# Patient Record
Sex: Male | Born: 1955
Health system: Southern US, Community
[De-identification: ages and names within clinical notes are randomized; demographics above are authoritative.]

## PROBLEM LIST (undated history)

## (undated) DIAGNOSIS — M1990 Unspecified osteoarthritis, unspecified site: Secondary | ICD-10-CM

## (undated) DIAGNOSIS — E119 Type 2 diabetes mellitus without complications: Secondary | ICD-10-CM

## (undated) DIAGNOSIS — F840 Autistic disorder: Secondary | ICD-10-CM

## (undated) DIAGNOSIS — Z87442 Personal history of urinary calculi: Secondary | ICD-10-CM

## (undated) DIAGNOSIS — I10 Essential (primary) hypertension: Secondary | ICD-10-CM

## (undated) DIAGNOSIS — M549 Dorsalgia, unspecified: Secondary | ICD-10-CM

---

## 2004-09-23 DIAGNOSIS — N529 Male erectile dysfunction, unspecified: Secondary | ICD-10-CM | POA: Insufficient documentation

## 2007-03-24 DIAGNOSIS — F17200 Nicotine dependence, unspecified, uncomplicated: Secondary | ICD-10-CM | POA: Insufficient documentation

## 2008-02-23 DIAGNOSIS — E1159 Type 2 diabetes mellitus with other circulatory complications: Secondary | ICD-10-CM | POA: Insufficient documentation

## 2008-02-23 DIAGNOSIS — I10 Essential (primary) hypertension: Secondary | ICD-10-CM | POA: Insufficient documentation

## 2008-10-27 DIAGNOSIS — E7800 Pure hypercholesterolemia, unspecified: Secondary | ICD-10-CM | POA: Insufficient documentation

## 2014-07-22 LAB — PSA: PSA: 0.6

## 2014-07-22 LAB — CBC AND DIFFERENTIAL
HCT: 50 % (ref 41–53)
Hemoglobin: 18.1 g/dL — AB (ref 13.5–17.5)
Platelets: 309 10*3/uL (ref 150–399)
WBC: 9.6 10^3/mL

## 2014-07-22 LAB — LIPID PANEL
Cholesterol: 210 mg/dL — AB (ref 0–200)
HDL: 49 mg/dL (ref 35–70)
LDL Cholesterol: 137 mg/dL
LDl/HDL Ratio: 4.3
Triglycerides: 119 mg/dL (ref 40–160)

## 2014-07-22 LAB — BASIC METABOLIC PANEL
BUN: 9 mg/dL (ref 4–21)
Creatinine: 0.9 mg/dL (ref 0.6–1.3)
Glucose: 157 mg/dL
Potassium: 4.3 mmol/L (ref 3.4–5.3)
Sodium: 138 mmol/L (ref 137–147)

## 2014-07-22 LAB — HEPATIC FUNCTION PANEL
ALT: 54 U/L — AB (ref 10–40)
AST: 36 U/L (ref 14–40)
Alkaline Phosphatase: 81 U/L (ref 25–125)
Bilirubin, Total: 0.9 mg/dL

## 2014-07-22 LAB — HEMOGLOBIN A1C: Hemoglobin A1C: 6.7

## 2015-08-03 ENCOUNTER — Encounter: Payer: Self-pay | Admitting: Family Medicine

## 2015-08-14 ENCOUNTER — Other Ambulatory Visit: Payer: Self-pay | Admitting: Family Medicine

## 2015-11-14 ENCOUNTER — Telehealth: Payer: Self-pay | Admitting: Family Medicine

## 2015-11-14 NOTE — Telephone Encounter (Signed)
Pt wife called wanting her husband to get prescriptions for nicotine patches.  He is ready to stop smoking.  She needs an order because she can turn it into flex spending acct.  Her call back is [phone]  Thanks ,Barth Kirks

## 2015-11-14 NOTE — Telephone Encounter (Signed)
Ok to order these for him?

## 2015-11-14 NOTE — Telephone Encounter (Signed)
Dr. Reece Agar, his last visit was on 07/2014  Tobacco abuse, HTN, DM-A1C 6.7 and non-compliance, visit prior to that was 11/2012 Tobacco abuse and HTN, no DM diagnosis or A1C.  Do you want him to come in or send it in? Thanks  ED

## 2015-11-14 NOTE — Telephone Encounter (Signed)
Okay at 14 mg patches.

## 2015-11-14 NOTE — Telephone Encounter (Signed)
When was his last visit? 

## 2015-11-15 NOTE — Telephone Encounter (Signed)
Yes--appt needed--thanks

## 2015-11-15 NOTE — Telephone Encounter (Signed)
Left a message with below information on wife's voicemail, to call and make appointment-aa

## 2015-11-20 DIAGNOSIS — E119 Type 2 diabetes mellitus without complications: Secondary | ICD-10-CM | POA: Insufficient documentation

## 2015-11-21 ENCOUNTER — Ambulatory Visit (INDEPENDENT_AMBULATORY_CARE_PROVIDER_SITE_OTHER): Payer: BLUE CROSS/BLUE SHIELD | Admitting: Family Medicine

## 2015-11-21 ENCOUNTER — Encounter: Payer: Self-pay | Admitting: Family Medicine

## 2015-11-21 VITALS — BP 144/86 | HR 88 | Temp 98.6°F | Resp 16 | Wt 215.0 lb

## 2015-11-21 DIAGNOSIS — F17200 Nicotine dependence, unspecified, uncomplicated: Secondary | ICD-10-CM | POA: Diagnosis not present

## 2015-11-21 DIAGNOSIS — I10 Essential (primary) hypertension: Secondary | ICD-10-CM | POA: Diagnosis not present

## 2015-11-21 MED ORDER — AMLODIPINE BESYLATE 10 MG PO TABS: 10.0000 mg | ORAL_TABLET | Freq: Every evening | ORAL | Status: DC

## 2015-11-21 MED ORDER — BUPROPION HCL ER (XL) 150 MG PO TB24
150.0000 mg | ORAL_TABLET | Freq: Every day | ORAL | Status: DC
Start: 2015-11-21 — End: 2017-03-10

## 2015-11-21 MED ORDER — NICOTINE 21 MG/24HR TD PT24: 21.0000 mg | MEDICATED_PATCH | Freq: Every day | TRANSDERMAL | Status: DC

## 2015-11-21 NOTE — Progress Notes (Signed)
Patient ID: Colin Jackson, male   DOB: 04-12-1956, 60 y.o.   MRN: [id number]    Subjective:  HPI Pt is here today because he is ready to quit smoking. He has been smoking for many years, since about teenage years. He smokes about 2 packs a day. He is interested in patches. He has tried Chantix and he said it made him feel "weird". He tried with the patches in the past, but he did not stick with it.   Prior to Admission medications   Medication Sig Start Date End Date Taking? Authorizing Provider  amLODipine (NORVASC) 10 MG tablet TAKE 1 TABLET IN THE EVENING 08/14/15  Yes Richard Hulen Shouts., MD  hydrochlorothiazide (HYDRODIURIL) 25 MG tablet TAKE 1 TABLET BY MOUTH IN THE MORNING 08/14/15  Yes Richard Hulen Shouts., MD  losartan (COZAAR) 100 MG tablet TAKE 1 TABLET BY MOUTH IN THE EVENING 08/14/15  Yes Richard Hulen Shouts., MD  sildenafil (VIAGRA) 100 MG tablet Take by mouth. 08/30/13  Yes Historical Provider, MD    Patient Active Problem List   Diagnosis Date Noted  . Type 2 diabetes mellitus (HCC) 11/20/2015  . Pure hypercholesterolemia 10/27/2008  . Essential (primary) hypertension 02/23/2008  . Compulsive tobacco user syndrome 03/24/2007  . ED (erectile dysfunction) of organic origin 09/23/2004    History reviewed. No pertinent past medical history.  Social History   Social History  . Marital Status: Married    Spouse Name: N/A  . Number of Children: N/A  . Years of Education: N/A   Occupational History  . Not on file.   Social History Main Topics  . Smoking status: Current Every Day Smoker -- 2.00 packs/day  . Smokeless tobacco: Not on file  . Alcohol Use: Yes     Comment: " I drink a little bit on the weekends"  . Drug Use: No  . Sexual Activity: Not on file   Other Topics Concern  . Not on file   Social History Narrative    Allergies  Allergen Reactions  . Codeine     Review of Systems  Constitutional: Negative.   HENT: Negative.   Eyes: Negative.    Respiratory: Negative.   Cardiovascular: Negative.   Gastrointestinal: Negative.   Genitourinary: Negative.   Musculoskeletal: Negative.   Skin: Negative.   Neurological: Negative.   Endo/Heme/Allergies: Negative.   Psychiatric/Behavioral: Negative.      There is no immunization history on file for this patient. Objective:  BP 144/86 mmHg  Pulse 88  Temp(Src) 98.6 F (37 C) (Oral)  Resp 16  Wt 215 lb (97.523 kg)  SpO2 98%  Physical Exam  Constitutional: He is oriented to person, place, and time and well-developed, well-nourished, and in no distress.  Mildly obese white male, very ruddy complexion  HENT:  Head: Normocephalic.  Right Ear: External ear normal.  Left Ear: External ear normal.  Nose: Nose normal.  Eyes: Conjunctivae and EOM are normal. Pupils are equal, round, and reactive to light.  Neck: Normal range of motion. Neck supple.  Cardiovascular: Normal rate, regular rhythm, normal heart sounds and intact distal pulses.   Pulmonary/Chest: Effort normal and breath sounds normal.  Abdominal: Soft.  Musculoskeletal: Normal range of motion.  Neurological: He is alert and oriented to person, place, and time. He has normal reflexes. Gait normal. GCS score is 15.  Skin: Skin is warm and dry.  Psychiatric: Mood, memory, affect and judgment normal.    Lab Results  Component Value  Date   WBC 9.6 07/22/2014   HGB 18.1* 07/22/2014   HCT 50 07/22/2014   PLT 309 07/22/2014   CHOL 210* 07/22/2014   TRIG 119 07/22/2014   HDL 49 07/22/2014   LDLCALC 137 07/22/2014   PSA 0.6 07/22/2014   HGBA1C 6.7 07/22/2014    CMP     Component Value Date/Time   NA 138 07/22/2014   K 4.3 07/22/2014   BUN 9 07/22/2014   CREATININE 0.9 07/22/2014   AST 36 07/22/2014   ALT 54* 07/22/2014   ALKPHOS 81 07/22/2014    Assessment and Plan :  1. Compulsive tobacco user syndrome Encouraged to quit smoking - nicotine (NICODERM CQ - DOSED IN MG/24 HOURS) 21 mg/24hr patch; Place 1  patch (21 mg total) onto the skin daily.  Dispense: 30 patch; Refill: 3 - buPROPion (WELLBUTRIN XL) 150 MG 24 hr tablet; Take 1 tablet (150 mg total) by mouth daily.  Dispense: 30 tablet; Refill: 3  2. Essential (primary) hypertension Elevated today.  - amLODipine (NORVASC) 10 MG tablet; Take 1 tablet (10 mg total) by mouth every evening.  Dispense: 30 tablet; Refill: 5 3. Chronic noncompliance with follow-up Will only refill medications for 6 months at a time. CPE scheduled for 6 months. I have done the exam and reviewed the above chart and it is accurate to the best of my knowledge.  Patient was seen and examined by Dr. Julieanne Manson, and noted scribed by Dimas Chyle, CMA  Julieanne Manson MD [facility] 11/21/2015 1:58 PM

## 2015-12-30 ENCOUNTER — Other Ambulatory Visit: Payer: Self-pay | Admitting: Family Medicine

## 2016-02-16 ENCOUNTER — Other Ambulatory Visit: Payer: Self-pay | Admitting: Family Medicine

## 2016-05-08 ENCOUNTER — Encounter: Payer: BLUE CROSS/BLUE SHIELD | Admitting: Family Medicine

## 2016-05-20 DIAGNOSIS — M5441 Lumbago with sciatica, right side: Secondary | ICD-10-CM | POA: Diagnosis not present

## 2016-05-20 DIAGNOSIS — M25551 Pain in right hip: Secondary | ICD-10-CM | POA: Diagnosis not present

## 2016-05-20 DIAGNOSIS — M5136 Other intervertebral disc degeneration, lumbar region: Secondary | ICD-10-CM | POA: Diagnosis not present

## 2016-05-28 DIAGNOSIS — M5441 Lumbago with sciatica, right side: Secondary | ICD-10-CM | POA: Diagnosis not present

## 2016-05-28 DIAGNOSIS — M5136 Other intervertebral disc degeneration, lumbar region: Secondary | ICD-10-CM | POA: Diagnosis not present

## 2016-06-08 ENCOUNTER — Other Ambulatory Visit: Payer: Self-pay | Admitting: Family Medicine

## 2016-06-14 DIAGNOSIS — M5441 Lumbago with sciatica, right side: Secondary | ICD-10-CM | POA: Diagnosis not present

## 2016-06-14 DIAGNOSIS — M6281 Muscle weakness (generalized): Secondary | ICD-10-CM | POA: Diagnosis not present

## 2016-06-19 ENCOUNTER — Encounter: Payer: Self-pay | Admitting: Emergency Medicine

## 2016-06-19 ENCOUNTER — Emergency Department: Payer: Self-pay

## 2016-06-19 ENCOUNTER — Emergency Department
Admission: EM | Admit: 2016-06-19 | Discharge: 2016-06-19 | Disposition: A | Discharge status: Home / Self Care | Payer: Self-pay | Attending: Emergency Medicine | Admitting: Emergency Medicine

## 2016-06-19 DIAGNOSIS — R739 Hyperglycemia, unspecified: Secondary | ICD-10-CM

## 2016-06-19 DIAGNOSIS — M5441 Lumbago with sciatica, right side: Secondary | ICD-10-CM | POA: Insufficient documentation

## 2016-06-19 DIAGNOSIS — F17200 Nicotine dependence, unspecified, uncomplicated: Secondary | ICD-10-CM | POA: Insufficient documentation

## 2016-06-19 DIAGNOSIS — E1165 Type 2 diabetes mellitus with hyperglycemia: Secondary | ICD-10-CM | POA: Insufficient documentation

## 2016-06-19 DIAGNOSIS — R52 Pain, unspecified: Secondary | ICD-10-CM

## 2016-06-19 DIAGNOSIS — R202 Paresthesia of skin: Secondary | ICD-10-CM | POA: Insufficient documentation

## 2016-06-19 DIAGNOSIS — I10 Essential (primary) hypertension: Secondary | ICD-10-CM | POA: Insufficient documentation

## 2016-06-19 HISTORY — DX: Dorsalgia, unspecified: M54.9

## 2016-06-19 LAB — GLUCOSE, CAPILLARY: Glucose-Capillary: 295 mg/dL — ABNORMAL HIGH (ref 65–99)

## 2016-06-19 MED ORDER — KETOROLAC TROMETHAMINE 30 MG/ML IJ SOLN
30.0000 mg | Freq: Once | INTRAMUSCULAR | Status: AC
  Administered 2016-06-19: 30 mg via INTRAVENOUS

## 2016-06-19 MED ORDER — HYDROMORPHONE HCL 1 MG/ML IJ SOLN
1.0000 mg | Freq: Once | INTRAMUSCULAR | Status: AC
  Administered 2016-06-19: 1 mg via INTRAVENOUS

## 2016-06-19 MED ORDER — KETOROLAC TROMETHAMINE 30 MG/ML IJ SOLN
INTRAMUSCULAR | Status: AC
  Administered 2016-06-19: 30 mg via INTRAVENOUS
  Filled 2016-06-19: qty 1

## 2016-06-19 MED ORDER — HYDROMORPHONE HCL 1 MG/ML IJ SOLN
INTRAMUSCULAR | Status: AC
  Administered 2016-06-19: 1 mg via INTRAVENOUS
  Filled 2016-06-19: qty 1

## 2016-06-19 MED ORDER — OXYCODONE HCL 5 MG PO TABS: 5.0000 mg | ORAL_TABLET | Freq: Three times a day (TID) | ORAL | 0 refills | Status: DC | PRN

## 2016-06-19 NOTE — Discharge Instructions (Signed)
Continue medication as directed with the exception of tramadol which usually will discontinue taking. Begin taking Roxicodone as needed for pain. Call Dr. Elisabeth CaraGilbert's office to make an appointment to discuss your blood sugar. [facility] to make an appointment for continued management of your back pain.

## 2016-06-19 NOTE — ED Triage Notes (Signed)
Patient to ER for c/o lower back pain. Brought to ER by physical therapy department. Patient has chronic back pain and was being seen by them for the same. Patient states pain is same area, but worse. Patient reports radiating down right leg.

## 2016-06-19 NOTE — ED Notes (Signed)
Iv meds given for pain  Friend at bedside  Pt conts to hyperventilate encourage to take slow breaths

## 2016-06-19 NOTE — ED Notes (Signed)
Patient transported to MRI via stretcher by MRI staff

## 2016-06-19 NOTE — ED Provider Notes (Signed)
[facility] Emergency Department Provider Note   ____________________________________________   First MD Initiated Contact with Patient 06/19/16 1007     (approximate)  I have reviewed the triage vital signs and the nursing notes.   HISTORY  Chief Complaint Back Pain   HPI Colin Jackson is a 60 y.o. male his brother to the emergency room by [facility] when the patient was there for physical therapy. Patient began screaming that his back pain was worse and was brought to the emergency room for pain management and further evaluation. Patient states it is been no new injury and that he has had back pain for many months. Patient states that he has been seeing the orthopedic Department at [facility] for his back and was told that he had lots of arthritis. Currently he is experiencing pain radiating down his right leg. He states the pain is more severe today than what he is experienced in the past. He currently is taking medication that was prescribed including tramadol, prednisone Dosepak, and a muscle relaxant. Patient denies any saddle paresthesias or incontinence of bowel or bladder. Patient actually drove to physical therapy but is calling someone to pick him up. Currently he rates his pain as a 10 over 10.   Past Medical History:  Diagnosis Date  . Back pain     Patient Active Problem List   Diagnosis Date Noted  . Type 2 diabetes mellitus (HCC) 11/20/2015  . Pure hypercholesterolemia 10/27/2008  . Essential (primary) hypertension 02/23/2008  . Compulsive tobacco user syndrome 03/24/2007  . ED (erectile dysfunction) of organic origin 09/23/2004    History reviewed. No pertinent surgical history.  Prior to Admission medications   Medication Sig Start Date End Date Taking? Authorizing Provider  amLODipine (NORVASC) 10 MG tablet Take 1 tablet (10 mg total) by mouth every evening. 11/21/15   Maple Hudson., MD  amLODipine (NORVASC) 10  MG tablet TAKE 1 TABLET BY MOUTH IN THE EVENING 12/30/15   Richard Hulen Shouts., MD  amLODipine (NORVASC) 10 MG tablet TAKE 1 TABLET BY MOUTH IN THE EVENING 02/20/16   Richard Hulen Shouts., MD  buPROPion (WELLBUTRIN XL) 150 MG 24 hr tablet Take 1 tablet (150 mg total) by mouth daily. 11/21/15   Maple Hudson., MD  hydrochlorothiazide (HYDRODIURIL) 25 MG tablet TAKE 1 TABLET BY MOUTH IN THE MORNING 06/10/16   Maple Hudson., MD  losartan (COZAAR) 100 MG tablet TAKE 1 TABLET BY MOUTH IN THE EVENING 12/30/15   Richard Hulen Shouts., MD  losartan (COZAAR) 100 MG tablet TAKE 1 TABLET BY MOUTH IN THE EVENING 02/20/16   Richard Hulen Shouts., MD  oxyCODONE (ROXICODONE) 5 MG immediate release tablet Take 1 tablet (5 mg total) by mouth every 8 (eight) hours as needed. 06/19/16 06/19/17  Tommi Rumps, PA-C  sildenafil (VIAGRA) 100 MG tablet Take by mouth. 08/30/13   Historical Provider, MD    Allergies Codeine  Family History  Problem Relation Age of Onset  . Dementia Father   . Parkinson's disease Father     Social History Social History  Substance Use Topics  . Smoking status: Current Every Day Smoker    Packs/day: 2.00  . Smokeless tobacco: Never Used  . Alcohol use Yes     Comment: " I drink a little bit on the weekends"    Review of Systems Constitutional: No fever/chills Eyes: No visual changes. Cardiovascular: Denies chest pain. Respiratory: Denies  shortness of breath. Gastrointestinal: No abdominal pain.  No nausea, no vomiting.  Genitourinary: Negative for dysuria. Musculoskeletal: Positive for chronic back pain. Positive for right leg paresthesias. Skin: Negative for rash. Neurological: Negative for headaches  10-point ROS otherwise negative.  ____________________________________________   PHYSICAL EXAM:  VITAL SIGNS: ED Triage Vitals  Enc Vitals Group     BP --      Pulse Rate 06/19/16 0940 83     Resp 06/19/16 0940 (!) 22     Temp 06/19/16 0940 97.6 F  (36.4 C)     Temp Source 06/19/16 0940 Axillary     SpO2 06/19/16 0940 98 %     Weight 06/19/16 0940 210 lb (95.3 kg)     Height 06/19/16 0940 5\' 10"  (1.778 m)     Head Circumference --      Peak Flow --      Pain Score 06/19/16 0931 10     Pain Loc --      Pain Edu? --      Excl. in GC? --     Constitutional: Alert and oriented. Well appearing and in no acute distress. Patient appears to be in obvious great deal distress and unable to sit completely on the stretcher due to severe pain in his right lower back and leg. Patient is in the exam room frequently screaming about the pain. Eyes: Conjunctivae are normal. PERRL. EOMI. Head: Atraumatic. Nose: No congestion/rhinnorhea. Neck: No stridor.   Cardiovascular: Normal rate, regular rhythm. Grossly normal heart sounds.  Good peripheral circulation. Respiratory: Normal respiratory effort.  No retractions. Lungs CTAB. Gastrointestinal: Soft and nontender. No distention. Musculoskeletal: Examination of the back there is no gross deformity noted. There is marked tenderness on palpation of the right SI joint area. Range of motion of the right lower extremity is guarded and patient is unable to move or sit secondary to his pain. Straight leg raises were not done due to patient's and tolerable pain. Reflexes grossly one plus bilaterally. There is no point tenderness on palpation of the lumbar spine. Patient was able to stand. Patient is reluctant to light on the stretcher secondary to his pain. Neurologic:  Normal speech and language. No gross focal neurologic deficits are appreciated. No gait instability. Skin:  Skin is warm, dry and intact. No rash noted. Psychiatric: Mood and affect are normal. Speech and behavior are normal.  ____________________________________________   LABS (all labs ordered are listed, but only abnormal results are displayed)  Labs Reviewed  GLUCOSE, CAPILLARY - Abnormal; Notable for the following:       Result Value     Glucose-Capillary 295 (*)    All other components within normal limits  CBG MONITORING, ED   ____________________________________________  RADIOLOGY MRI Lumbar spine per radiologist    IMPRESSION:  1. L5-S1 right foraminal herniation with L5 compression.  2. Facet arthropathy with marrow edema on the left at L4-5.      ____________________________________________   PROCEDURES  Procedure(s) performed: None  Procedures  Critical Care performed: No  ____________________________________________   INITIAL IMPRESSION / ASSESSMENT AND PLAN / ED COURSE  Pertinent labs & imaging results that were available during my care of the patient were reviewed by me and considered in my medical decision making (see chart for details).    Clinical Course  Patient was given Dilaudid and Toradol IV all in the emergency room which eventually gave him some pain relief. After reviewing notes from [facility]  clinic orthopedic department and MRI was  scheduled. Patient was given the results of this examination. He is made aware of the abnormalities. He is to call and follow-up with the orthopedic department for continued pain control as well as therapy. Prior to discharge patient was more comfortable was able to lie on the stretcher without any difficulty. Patient also tolerated the MRI without additional pain medication. Patient was discharged on Roxicodone 5 mg. He is to discontinue taking tramadol for pain. He is continue his other medication as he regular takes.  Patient also had a blood sugar of 295. Her inpatient records it was noted that he was type II diabetic but patient states that he has no knowledge of this. He is encouraged to make an appointment with Dr. Elisabeth CaraGilbert's office as soon as possible to discuss these findings.  ____________________________________________   FINAL CLINICAL IMPRESSION(S) / ED DIAGNOSES  Final diagnoses:  Intractable pain  Right-sided low back pain with  right-sided sciatica  Hyperglycemia      NEW MEDICATIONS STARTED DURING THIS VISIT:  Discharge Medication List as of 06/19/2016 12:49 PM    START taking these medications   Details  oxyCODONE (ROXICODONE) 5 MG immediate release tablet Take 1 tablet (5 mg total) by mouth every 8 (eight) hours as needed., Starting Wed 06/19/2016, Until Thu 06/19/2017, Print         Note:  This document was prepared using Dragon voice recognition software and may include unintentional dictation errors.    Tommi RumpsRhonda L Summers, PA-C 06/19/16 1521    Nita Sicklearolina Veronese, MD 06/20/16 [phone]

## 2016-06-20 DIAGNOSIS — M5410 Radiculopathy, site unspecified: Secondary | ICD-10-CM | POA: Diagnosis not present

## 2016-07-04 DIAGNOSIS — M5126 Other intervertebral disc displacement, lumbar region: Secondary | ICD-10-CM | POA: Diagnosis not present

## 2016-07-04 DIAGNOSIS — M5416 Radiculopathy, lumbar region: Secondary | ICD-10-CM | POA: Diagnosis not present

## 2016-08-01 ENCOUNTER — Other Ambulatory Visit: Payer: Self-pay | Admitting: Family Medicine

## 2016-08-01 LAB — CBC AND DIFFERENTIAL
HCT: 49 % (ref 41–53)
Hemoglobin: 17.4 g/dL (ref 13.5–17.5)
Platelets: 277 10*3/uL (ref 150–399)
WBC: 10 10^3/mL

## 2016-08-01 LAB — HEPATIC FUNCTION PANEL
ALT: 38 U/L (ref 10–40)
AST: 26 U/L (ref 14–40)
Alkaline Phosphatase: 107 U/L (ref 25–125)
Bilirubin, Total: 0.8 mg/dL

## 2016-08-01 LAB — BASIC METABOLIC PANEL
BUN: 12 mg/dL (ref 4–21)
Creatinine: 0.8 mg/dL (ref 0.6–1.3)
Glucose: 282 mg/dL
Potassium: 4.6 mmol/L (ref 3.4–5.3)
Sodium: 137 mmol/L (ref 137–147)

## 2016-08-01 LAB — HEMOGLOBIN A1C: Hemoglobin A1C: 10.3

## 2016-08-01 LAB — LIPID PANEL
Cholesterol: 206 mg/dL — AB (ref 0–200)
HDL: 52 mg/dL (ref 35–70)
LDL Cholesterol: 132 mg/dL
LDl/HDL Ratio: 4
Triglycerides: 108 mg/dL (ref 40–160)

## 2016-08-01 LAB — PSA: PSA: 0.6

## 2016-08-01 MED ORDER — HYDROCHLOROTHIAZIDE 25 MG PO TABS: 25.0000 mg | ORAL_TABLET | Freq: Every morning | ORAL | 0 refills | Status: DC

## 2016-08-01 NOTE — Telephone Encounter (Signed)
Pt's wife contacted office for refill request on the following medications: hydrochlorothiazide (HYDRODIURIL) 25 MG tablet  [facility] [address] Pt is scheduled for F/U on 08/06/16 and is out of the medication.Please advise. Thanks TNP

## 2016-08-01 NOTE — Telephone Encounter (Signed)
Done-aa 

## 2016-08-06 ENCOUNTER — Ambulatory Visit: Payer: BLUE CROSS/BLUE SHIELD | Admitting: Family Medicine

## 2016-08-06 NOTE — Progress Notes (Signed)
o

## 2016-08-07 ENCOUNTER — Other Ambulatory Visit: Payer: Self-pay

## 2016-08-07 ENCOUNTER — Encounter: Payer: Self-pay | Admitting: Family Medicine

## 2016-08-07 ENCOUNTER — Ambulatory Visit (INDEPENDENT_AMBULATORY_CARE_PROVIDER_SITE_OTHER): Payer: BLUE CROSS/BLUE SHIELD | Admitting: Family Medicine

## 2016-08-07 VITALS — BP 142/98 | HR 76 | Temp 98.3°F | Resp 16 | Wt 206.6 lb

## 2016-08-07 DIAGNOSIS — E119 Type 2 diabetes mellitus without complications: Secondary | ICD-10-CM | POA: Diagnosis not present

## 2016-08-07 DIAGNOSIS — F17200 Nicotine dependence, unspecified, uncomplicated: Secondary | ICD-10-CM

## 2016-08-07 DIAGNOSIS — I10 Essential (primary) hypertension: Secondary | ICD-10-CM | POA: Diagnosis not present

## 2016-08-07 LAB — GLUCOSE, POCT (MANUAL RESULT ENTRY): POC Glucose: 356 mg/dl — AB (ref 70–99)

## 2016-08-07 MED ORDER — METFORMIN HCL 1000 MG PO TABS: 1000.0000 mg | ORAL_TABLET | Freq: Every evening | ORAL | 3 refills | Status: DC

## 2016-08-07 MED ORDER — AUTO-LANCET MISC: 3 refills | Status: DC

## 2016-08-07 NOTE — Progress Notes (Signed)
Patient: Colin LinemanCharles R Jackson Male    DOB: Apr 21, 1956   60 y.o.   MRN: [id number] Visit Date: 08/07/2016  Today's Provider: Megan Mansichard Diago Haik Jr, MD   Chief Complaint  Patient presents with  . Follow-up   Subjective:    HPI Patient is here for a 9 month follow up. Last OV was 11/21/2015. Patient started Bupropion and Nicoderm patches for smoking cessation. Patient reports poor compliance with treatment plan. He reports smoking  2 packs per day, and has not tried the medication yet.     Hypertension, follow-up:  BP Readings from Last 3 Encounters:  08/07/16 (!) 142/98  06/19/16 (!) 133/99  11/21/15 (!) 144/86    He was last seen for hypertension 9 months ago.  BP at that visit was 144/86. Management changes since that visit include continue Amlodipine 10 mg. He reports excellent compliance with treatment. He is not having side effects.  He is not exercising. He is adherent to low salt diet.   Outside blood pressures are being checked sometimes. He is experiencing none.  Patient denies chest pain, irregular heart beat and palpitations.   Cardiovascular risk factors include advanced age (older than 7955 for men, 6565 for women), diabetes mellitus, hypertension, male gender and smoking/ tobacco exposure.  Use of agents associated with hypertension: none.     Weight trend: stable Wt Readings from Last 3 Encounters:  08/07/16 206 lb 9.6 oz (93.7 kg)  06/19/16 210 lb (95.3 kg)  11/21/15 215 lb (97.5 kg)    Current diet: in general, an "unhealthy" diet   Diabetes Mellitus Type II, Initial Visit  Lab Results  Component Value Date   HGBA1C 10.3 08/01/2016   HGBA1C 6.7 07/22/2014   Labs done for work insurance. This is a new diagnosis. No previous treatment.       Pertinent Labs:    Component Value Date/Time   CHOL 206 (A) 08/01/2016   TRIG 108 08/01/2016   HDL 52 08/01/2016   LDLCALC 132 08/01/2016   CREATININE 0.8 08/01/2016    Wt Readings from Last 3 Encounters:    08/07/16 206 lb 9.6 oz (93.7 kg)  06/19/16 210 lb (95.3 kg)  11/21/15 215 lb (97.5 kg)    ------------------------------------------------------------------------    Previous Medications   AMLODIPINE (NORVASC) 10 MG TABLET    TAKE 1 TABLET BY MOUTH IN THE EVENING   BUPROPION (WELLBUTRIN XL) 150 MG 24 HR TABLET    Take 1 tablet (150 mg total) by mouth daily.   HYDROCHLOROTHIAZIDE (HYDRODIURIL) 25 MG TABLET    Take 1 tablet (25 mg total) by mouth every morning.   LOSARTAN (COZAAR) 100 MG TABLET    TAKE 1 TABLET BY MOUTH IN THE EVENING   SILDENAFIL (VIAGRA) 100 MG TABLET    Take by mouth.    Review of Systems  Constitutional: Negative.   Respiratory: Negative.   Cardiovascular: Negative.   Endocrine: Negative.   Musculoskeletal: Positive for arthralgias.    Social History  Substance Use Topics  . Smoking status: Current Every Day Smoker    Packs/day: 2.00  . Smokeless tobacco: Never Used  . Alcohol use Yes     Comment: " I drink a little bit on the weekends"   Objective:   BP (!) 142/98 (BP Location: Right Arm, Patient Position: Sitting, Cuff Size: Normal)   Pulse 76   Temp 98.3 F (36.8 C) (Oral)   Resp 16   Wt 206 lb 9.6 oz (93.7 kg)   BMI 29.64  kg/m   Physical Exam  Constitutional: He is oriented to person, place, and time. He appears well-developed and well-nourished.  HENT:  Head: Normocephalic and atraumatic.  Right Ear: External ear normal.  Left Ear: External ear normal.  Mouth/Throat: Oropharynx is clear and moist.  Eyes: Conjunctivae and EOM are normal. Pupils are equal, round, and reactive to light. No scleral icterus.  Neck: Neck supple. No thyromegaly present.  Cardiovascular: Normal rate, regular rhythm, normal heart sounds and intact distal pulses.   Pulmonary/Chest: Effort normal. No respiratory distress. He exhibits no tenderness.  Diffuse mild expiratory wheezes/rhonchi.  Abdominal: Soft. Bowel sounds are normal.  Musculoskeletal: Normal range  of motion.  Neurological: He is alert and oriented to person, place, and time. He has normal reflexes.  Skin: Skin is warm and dry.  Psychiatric: He has a normal mood and affect. His behavior is normal. Judgment and thought content normal.        Assessment & Plan:     1. Essential (primary) hypertension BP elevated today. Continue Amlodipine 10 mg, HCTZ 25 mg and Losartan 100 mg. Patient advised to bring in BP monitor to calibrate at next visit.  2. Type 2 diabetes mellitus without complication, without long-term current use of insulin (HCC) New diagnosis. Recommended referral to lifestyle center. Patient declined Try to cut back on alcohol intake to 1 drink per day. Start Metformin 1000 mg every evening. Glucometer given in office. Test strips and lancets sent to pharmacy. Check FBS every morning. Exercise as tolerated due to back pain. Advised patient that he will definitely need to be compliant with new diagnosis.   3. Compulsive tobacco user syndrome Try to cut back on smoking. Pt is aware of his need to make a lot of changes and says he plans to do this. 4.Early COPD Spirometry on next OV. 5.Noncompliance with follow up Follow up: Return in about 3 months (around 11/07/2016).

## 2016-08-12 ENCOUNTER — Encounter: Payer: Self-pay | Admitting: Family Medicine

## 2016-08-28 ENCOUNTER — Other Ambulatory Visit: Payer: Self-pay | Admitting: Family Medicine

## 2016-08-29 DIAGNOSIS — M5417 Radiculopathy, lumbosacral region: Secondary | ICD-10-CM | POA: Diagnosis not present

## 2016-09-14 ENCOUNTER — Other Ambulatory Visit: Payer: Self-pay | Admitting: Family Medicine

## 2016-11-07 ENCOUNTER — Ambulatory Visit (INDEPENDENT_AMBULATORY_CARE_PROVIDER_SITE_OTHER): Payer: BLUE CROSS/BLUE SHIELD | Admitting: Family Medicine

## 2016-11-07 ENCOUNTER — Encounter: Payer: Self-pay | Admitting: Family Medicine

## 2016-11-07 VITALS — BP 152/98 | HR 82 | Temp 97.9°F | Resp 16 | Wt 198.0 lb

## 2016-11-07 DIAGNOSIS — J439 Emphysema, unspecified: Secondary | ICD-10-CM | POA: Diagnosis not present

## 2016-11-07 DIAGNOSIS — E119 Type 2 diabetes mellitus without complications: Secondary | ICD-10-CM | POA: Diagnosis not present

## 2016-11-07 DIAGNOSIS — F17200 Nicotine dependence, unspecified, uncomplicated: Secondary | ICD-10-CM | POA: Diagnosis not present

## 2016-11-07 DIAGNOSIS — I10 Essential (primary) hypertension: Secondary | ICD-10-CM | POA: Diagnosis not present

## 2016-11-07 LAB — POCT GLYCOSYLATED HEMOGLOBIN (HGB A1C): Hemoglobin A1C: 7.5

## 2016-11-07 NOTE — Progress Notes (Signed)
Subjective:  HPI  Diabetes Mellitus Type II, Follow-up:   Lab Results  Component Value Date   HGBA1C 10.3 08/01/2016   HGBA1C 6.7 07/22/2014    Last seen for diabetes 3 months ago.  Management since then includes Start Metformin. He reports poor compliance with treatment. He is not having side effects.  Home blood sugar records: not being checked  Episodes of hypoglycemia? no   Current Insulin Regimen: n/a  Current exercise: none  Pertinent Labs:    Component Value Date/Time   CHOL 206 (A) 08/01/2016   TRIG 108 08/01/2016   HDL 52 08/01/2016   LDLCALC 132 08/01/2016   CREATININE 0.8 08/01/2016    Wt Readings from Last 3 Encounters:  11/07/16 198 lb (89.8 kg)  08/07/16 206 lb 9.6 oz (93.7 kg)  06/19/16 210 lb (95.3 kg)    ------------------------------------------------------------------------  Hypertension, follow-up:  BP Readings from Last 3 Encounters:  11/07/16 (!) 152/98  08/07/16 (!) 142/98  06/19/16 (!) 133/99    He was last seen for hypertension 3 months ago.  BP at that visit was 142/98. Management since that visit includes none. He reports fair compliance with treatment. He is not having side effects.  He is not exercising. He is not adherent to low salt diet.   Wt Readings from Last 3 Encounters:  11/07/16 198 lb (89.8 kg)  08/07/16 206 lb 9.6 oz (93.7 kg)  06/19/16 210 lb (95.3 kg)   ------------------------------------------------------------------------  Early COPD- pt is still smoking. Spirometry performed per last OV note. Pt denies shortness of breath   Prior to Admission medications   Medication Sig Start Date End Date Taking? Authorizing Provider  amLODipine (NORVASC) 10 MG tablet TAKE 1 TABLET BY MOUTH IN THE EVENING 02/20/16   Richard Hulen Shouts., MD  buPROPion (WELLBUTRIN XL) 150 MG 24 hr tablet Take 1 tablet (150 mg total) by mouth daily. Patient not taking: Reported on 08/07/2016 11/21/15   Maple Hudson., MD    hydrochlorothiazide (HYDRODIURIL) 25 MG tablet TAKE 1 TABLET (25 MG TOTAL) BY MOUTH EVERY MORNING.**NEEDS OFFICE VISIT** 08/28/16   Maple Hudson., MD  Lancet Devices (AUTO-LANCET) MISC One Touch Verio Flex Lancets and test strips. Check fasting blood sugars each morning. DX:E11.9 08/07/16   Maple Hudson., MD  losartan (COZAAR) 100 MG tablet TAKE 1 TABLET BY MOUTH IN THE EVENING 09/15/16   Richard Hulen Shouts., MD  metFORMIN (GLUCOPHAGE) 1000 MG tablet Take 1 tablet (1,000 mg total) by mouth every evening. 08/07/16   Richard Hulen Shouts., MD  sildenafil (VIAGRA) 100 MG tablet Take by mouth. 08/30/13   Historical Provider, MD    Patient Active Problem List   Diagnosis Date Noted  . Type 2 diabetes mellitus (HCC) 11/20/2015  . Pure hypercholesterolemia 10/27/2008  . Essential (primary) hypertension 02/23/2008  . Compulsive tobacco user syndrome 03/24/2007  . ED (erectile dysfunction) of organic origin 09/23/2004    Past Medical History:  Diagnosis Date  . Back pain     Social History   Social History  . Marital status: Married    Spouse name: N/A  . Number of children: N/A  . Years of education: N/A   Occupational History  . Not on file.   Social History Main Topics  . Smoking status: Current Every Day Smoker    Packs/day: 2.00  . Smokeless tobacco: Never Used  . Alcohol use Yes     Comment: " I drink a little  bit on the weekends"  . Drug use: No  . Sexual activity: Not on file   Other Topics Concern  . Not on file   Social History Narrative  . No narrative on file    Allergies  Allergen Reactions  . Codeine     Review of Systems  Constitutional: Negative.   HENT: Negative.   Eyes: Negative.   Respiratory: Negative.   Cardiovascular: Negative.   Gastrointestinal: Negative.   Genitourinary: Negative.   Musculoskeletal: Negative.   Skin: Negative.   Neurological: Negative.   Endo/Heme/Allergies: Negative.   Psychiatric/Behavioral:  Negative.      There is no immunization history on file for this patient.  Objective:  BP (!) 152/98 (BP Location: Left Arm, Patient Position: Sitting, Cuff Size: Normal)   Pulse 82   Temp 97.9 F (36.6 C) (Oral)   Resp 16   Wt 198 lb (89.8 kg)   SpO2 99%   BMI 28.41 kg/m   Physical Exam  Constitutional: He is oriented to person, place, and time and well-developed, well-nourished, and in no distress.  HENT:  Head: Normocephalic and atraumatic.  Right Ear: External ear normal.  Left Ear: External ear normal.  Nose: Nose normal.  Eyes: Conjunctivae and EOM are normal. Pupils are equal, round, and reactive to light.  Neck: Normal range of motion. Neck supple.  Cardiovascular: Normal rate, regular rhythm, normal heart sounds and intact distal pulses.   Pulmonary/Chest: Effort normal and breath sounds normal.  Abdominal: Soft.  Musculoskeletal: Normal range of motion.  Neurological: He is alert and oriented to person, place, and time. He has normal reflexes. Gait normal. GCS score is 15.  Skin: Skin is warm and dry.  Psychiatric: Mood, memory, affect and judgment normal.    Lab Results  Component Value Date   WBC 10.0 08/01/2016   HGB 17.4 08/01/2016   HCT 49 08/01/2016   PLT 277 08/01/2016   CHOL 206 (A) 08/01/2016   TRIG 108 08/01/2016   HDL 52 08/01/2016   LDLCALC 132 08/01/2016   PSA 0.6 08/01/2016   HGBA1C 10.3 08/01/2016    CMP     Component Value Date/Time   NA 137 08/01/2016   K 4.6 08/01/2016   BUN 12 08/01/2016   CREATININE 0.8 08/01/2016   AST 26 08/01/2016   ALT 38 08/01/2016   ALKPHOS 107 08/01/2016    Assessment and Plan :  1. Type 2 diabetes mellitus without complication, without long-term current use of insulin (HCC) Patient has done well in  losing 10 pounds since his last visit. - POCT HgB A1C 7.5 today. Improved. He is not taking Metformin. As long as he continues lifestyle changes he does not need medication. Follow up in 4 months.   2.  Essential (primary) hypertension Elevated.   3. Compulsive tobacco user syndrome Quit. - Spirometry with Graph  4. Pulmonary emphysema, unspecified emphysema type (HCC)  I have done the exam and reviewed the chart and it is accurate to the best of my knowledge. DentistDragon  technology has been used and  any errors in dictation or transcription are unintentional. Julieanne Mansonichard Gilbert M.D. [facility]   HPI, Exam, and A&P Transcribed under the direction and in the presence of Richard L. Wendelyn BreslowGilbert Jr, MD  Electronically Signed: Silvio PateBrittany O'Dell, CMA  Julieanne Mansonichard Gilbert MD [facility] 11/07/2016 8:40 AM

## 2016-12-29 ENCOUNTER — Other Ambulatory Visit: Payer: Self-pay | Admitting: Family Medicine

## 2017-03-10 ENCOUNTER — Ambulatory Visit (INDEPENDENT_AMBULATORY_CARE_PROVIDER_SITE_OTHER): Payer: BLUE CROSS/BLUE SHIELD | Admitting: Family Medicine

## 2017-03-10 ENCOUNTER — Encounter: Payer: Self-pay | Admitting: Family Medicine

## 2017-03-10 VITALS — BP 158/86 | HR 84 | Resp 18 | Wt 195.0 lb

## 2017-03-10 DIAGNOSIS — Z5320 Procedure and treatment not carried out because of patient's decision for unspecified reasons: Secondary | ICD-10-CM | POA: Diagnosis not present

## 2017-03-10 DIAGNOSIS — I10 Essential (primary) hypertension: Secondary | ICD-10-CM | POA: Diagnosis not present

## 2017-03-10 DIAGNOSIS — Z289 Immunization not carried out for unspecified reason: Secondary | ICD-10-CM | POA: Diagnosis not present

## 2017-03-10 DIAGNOSIS — E7800 Pure hypercholesterolemia, unspecified: Secondary | ICD-10-CM

## 2017-03-10 DIAGNOSIS — E119 Type 2 diabetes mellitus without complications: Secondary | ICD-10-CM

## 2017-03-10 DIAGNOSIS — F17200 Nicotine dependence, unspecified, uncomplicated: Secondary | ICD-10-CM | POA: Diagnosis not present

## 2017-03-10 DIAGNOSIS — Z2821 Immunization not carried out because of patient refusal: Secondary | ICD-10-CM

## 2017-03-10 LAB — POCT GLYCOSYLATED HEMOGLOBIN (HGB A1C): Hemoglobin A1C: 6.5

## 2017-03-10 NOTE — Patient Instructions (Signed)
Please get a record of your last eye exam for us to update your records please. Will get your Tdap and Pneumonia vaccine on the next visit.  When you have your next lab work done through work please ask them to have Hepatitis C screening done. This is just a recommended test for people that were born between certain years.  Thank you so much.

## 2017-03-10 NOTE — Progress Notes (Signed)
Colin LinemanCharles R Jackson  MRN: [id number] DOB: 1956/06/24  Subjective:  HPI  Patient is here for 4 months follow up. Last office visit was on 11/07/16. DM: patient is not checking his sugar at home. He is working on dietary changes. No numbness or tingling sensation present. Patient had eye exam in 2017 thinks it was with [employer] maybe. Lab Results  Component Value Date   HGBA1C 7.5 11/07/2016   B/P: patient checks his b/p sometimes. Readings around 150s/80s or so. No cardiac symptoms. BP Readings from Last 3 Encounters:  03/10/17 (!) 158/86  11/07/16 (!) 152/98  08/07/16 (!) 142/98   Wt Readings from Last 3 Encounters:  03/10/17 195 lb (88.5 kg)  11/07/16 198 lb (89.8 kg)  08/07/16 206 lb 9.6 oz (93.7 kg)   Last lab work was done on 08/01/16. Patient states he will have lab work done through work in 3 weeks. Patient declines getting immunizations today or urine micro albumin. He knows he needs to quit smoking but has little desire to make any other changes. Patient Active Problem List   Diagnosis Date Noted  . Type 2 diabetes mellitus (HCC) 11/20/2015  . Pure hypercholesterolemia 10/27/2008  . Essential (primary) hypertension 02/23/2008  . Compulsive tobacco user syndrome 03/24/2007  . ED (erectile dysfunction) of organic origin 09/23/2004    Past Medical History:  Diagnosis Date  . Back pain     Social History   Social History  . Marital status: Married    Spouse name: N/A  . Number of children: N/A  . Years of education: N/A   Occupational History  . Not on file.   Social History Main Topics  . Smoking status: Current Every Day Smoker    Packs/day: 2.00  . Smokeless tobacco: Never Used  . Alcohol use Yes     Comment: 2 malt beers a day  . Drug use: No  . Sexual activity: Not on file   Other Topics Concern  . Not on file   Social History Narrative  . No narrative on file    Outpatient Encounter Prescriptions as of 03/10/2017  Medication Sig Note  .  amLODipine (NORVASC) 10 MG tablet TAKE 1 TABLET BY MOUTH IN THE EVENING   . hydrochlorothiazide (HYDRODIURIL) 25 MG tablet TAKE 1 TABLET (25 MG TOTAL) BY MOUTH EVERY MORNING.**NEEDS OFFICE VISIT**   . losartan (COZAAR) 100 MG tablet TAKE 1 TABLET BY MOUTH IN THE EVENING   . [DISCONTINUED] buPROPion (WELLBUTRIN XL) 150 MG 24 hr tablet Take 1 tablet (150 mg total) by mouth daily. (Patient not taking: Reported on 08/07/2016)   . [DISCONTINUED] hydrochlorothiazide (HYDRODIURIL) 25 MG tablet TAKE 1 TABLET (25 MG TOTAL) BY MOUTH EVERY MORNING.**NEEDS OFFICE VISIT**   . [DISCONTINUED] Lancet Devices (AUTO-LANCET) MISC One Touch Verio Flex Lancets and test strips. Check fasting blood sugars each morning. DX:E11.9 (Patient not taking: Reported on 11/07/2016)   . [DISCONTINUED] metFORMIN (GLUCOPHAGE) 1000 MG tablet Take 1 tablet (1,000 mg total) by mouth every evening. (Patient not taking: Reported on 11/07/2016)   . [DISCONTINUED] sildenafil (VIAGRA) 100 MG tablet Take by mouth. 11/20/2015: Medication taken as needed. DX: 607.84 [facility]   No facility-administered encounter medications on file as of 03/10/2017.     Allergies  Allergen Reactions  . Codeine     Review of Systems  Constitutional: Negative.   Eyes: Negative.   Respiratory: Negative.   Cardiovascular: Negative.   Gastrointestinal: Negative.   Genitourinary: Negative.   Musculoskeletal:  Positive for back pain (off and on).  Skin: Negative.   Neurological: Negative.   Endo/Heme/Allergies: Negative.   Psychiatric/Behavioral: Negative.     Objective:  BP (!) 158/86   Pulse 84   Resp 18   Wt 195 lb (88.5 kg)   BMI 27.98 kg/m   Physical Exam  Constitutional: He is oriented to person, place, and time and well-developed, well-nourished, and in no distress.  HENT:  Head: Normocephalic and atraumatic.  Right Ear: External ear normal.  Left Ear: External ear normal.  Nose: Nose normal.  Eyes:  Conjunctivae are normal. Pupils are equal, round, and reactive to light. No scleral icterus.  Neck: Normal range of motion. Neck supple. No thyromegaly present.  Cardiovascular: Normal rate, regular rhythm, normal heart sounds and intact distal pulses.  Exam reveals no gallop.   No murmur heard. Pulmonary/Chest: Effort normal and breath sounds normal. No respiratory distress. He has no wheezes.  Abdominal: Soft.  Musculoskeletal: He exhibits no edema or tenderness.  Neurological: He is alert and oriented to person, place, and time. Gait normal. GCS score is 15.  Skin: Skin is warm and dry.  Psychiatric: Mood, memory, affect and judgment normal.   Assessment and Plan :  1. Essential (primary) hypertension Elevated still. Discussed this in detail with patient and the importance of getting this reading to be better.  2. Compulsive tobacco user syndrome Patient advised to quit. If patient can stop this I think b/p could get better.  3. Type 2 diabetes mellitus without complication, without long-term current use of insulin (HCC) A1C 6.5 better. Continue working on habits. Follow. - POCT HgB A1C  4. Pure hypercholesterolemia  5. Tetanus, diphtheria, and acellular pertussis (Tdap) vaccination declined Will get this updated on next visit. 6. Pneumococcal vaccine refused Need to get this done on the next visit.  7. Colonoscopy refused 8.Noncompliance history  HPI, Exam and A&P transcribed by Samara Deist, RMA under direction and in the presence of Julieanne Manson, MD. I have done the exam and reviewed the chart and it is accurate to the best of my knowledge. Dentist has been used and  any errors in dictation or transcription are unintentional. Julieanne Manson M.D. [facility]

## 2017-06-19 LAB — BASIC METABOLIC PANEL
BUN: 14 (ref 4–21)
Creatinine: 0.8 (ref 0.6–1.3)
Glucose: 141
Potassium: 4.7 (ref 3.4–5.3)
Sodium: 140 (ref 137–147)

## 2017-06-19 LAB — PSA: PSA: 1

## 2017-06-19 LAB — HEPATIC FUNCTION PANEL
ALT: 25 (ref 10–40)
AST: 22 (ref 14–40)
Alkaline Phosphatase: 79 (ref 25–125)
Bilirubin, Total: 0.7

## 2017-06-19 LAB — CBC AND DIFFERENTIAL
HCT: 49 (ref 41–53)
Hemoglobin: 17.5 (ref 13.5–17.5)
Platelets: 312 (ref 150–399)
WBC: 10.1

## 2017-06-19 LAB — LIPID PANEL
Cholesterol: 182 (ref 0–200)
HDL: 59 (ref 35–70)
LDL Cholesterol: 99
LDl/HDL Ratio: 3.1
Triglycerides: 120 (ref 40–160)

## 2017-06-19 LAB — HEMOGLOBIN A1C: Hemoglobin A1C: 6.9

## 2017-06-24 LAB — IRON: Iron: 98

## 2017-09-01 ENCOUNTER — Ambulatory Visit: Payer: BLUE CROSS/BLUE SHIELD | Admitting: Family Medicine

## 2017-10-02 ENCOUNTER — Ambulatory Visit: Payer: BLUE CROSS/BLUE SHIELD | Admitting: Family Medicine

## 2017-10-07 ENCOUNTER — Ambulatory Visit (INDEPENDENT_AMBULATORY_CARE_PROVIDER_SITE_OTHER): Payer: BLUE CROSS/BLUE SHIELD | Admitting: Family Medicine

## 2017-10-07 VITALS — BP 154/88 | HR 78 | Temp 97.9°F | Resp 14 | Wt 208.0 lb

## 2017-10-07 DIAGNOSIS — I10 Essential (primary) hypertension: Secondary | ICD-10-CM

## 2017-10-07 DIAGNOSIS — L989 Disorder of the skin and subcutaneous tissue, unspecified: Secondary | ICD-10-CM

## 2017-10-07 DIAGNOSIS — L309 Dermatitis, unspecified: Secondary | ICD-10-CM

## 2017-10-07 DIAGNOSIS — Z1211 Encounter for screening for malignant neoplasm of colon: Secondary | ICD-10-CM | POA: Diagnosis not present

## 2017-10-07 DIAGNOSIS — E7800 Pure hypercholesterolemia, unspecified: Secondary | ICD-10-CM | POA: Diagnosis not present

## 2017-10-07 DIAGNOSIS — Z2821 Immunization not carried out because of patient refusal: Secondary | ICD-10-CM

## 2017-10-07 DIAGNOSIS — F17200 Nicotine dependence, unspecified, uncomplicated: Secondary | ICD-10-CM | POA: Diagnosis not present

## 2017-10-07 DIAGNOSIS — Z5320 Procedure and treatment not carried out because of patient's decision for unspecified reasons: Secondary | ICD-10-CM | POA: Diagnosis not present

## 2017-10-07 DIAGNOSIS — Z6829 Body mass index (BMI) 29.0-29.9, adult: Secondary | ICD-10-CM

## 2017-10-07 DIAGNOSIS — E119 Type 2 diabetes mellitus without complications: Secondary | ICD-10-CM

## 2017-10-07 LAB — POCT GLYCOSYLATED HEMOGLOBIN (HGB A1C): Hemoglobin A1C: 7.2

## 2017-10-07 MED ORDER — AMLODIPINE BESYLATE 10 MG PO TABS
10.0000 mg | ORAL_TABLET | Freq: Every evening | ORAL | 3 refills | Status: DC
Start: 2017-10-07 — End: 2018-11-11

## 2017-10-07 MED ORDER — LOSARTAN POTASSIUM 100 MG PO TABS: 100.0000 mg | ORAL_TABLET | Freq: Every evening | ORAL | 3 refills | Status: DC

## 2017-10-07 MED ORDER — HYDROCHLOROTHIAZIDE 25 MG PO TABS: ORAL_TABLET | ORAL | 3 refills | Status: DC

## 2017-10-07 NOTE — Patient Instructions (Signed)
Please make sure to take all 3 of the medications for your blood pressure.

## 2017-10-07 NOTE — Progress Notes (Signed)
Colin Jackson  MRN: [id number] DOB: July 01, 1956  Subjective:  HPI  Patient is here for 6 months follow up. Last office visit was on 03/10/17.  DM: patient does not check his sugar at home. He is not on any medications for this. Has not worked on any specific diet or exercise. Lab Results  Component Value Date   HGBA1C 6.9 06/19/2017   Wt Readings from Last 3 Encounters:  10/07/17 208 lb (94.3 kg)  03/10/17 195 lb (88.5 kg)  11/07/16 198 lb (89.8 kg)   HTN: patient is taking medications for this but not sure which ones. On his list with [place] he is to take Amlodipine, HCTZ and Losartan but only takes 2 of these and not sure which ones. He is not checking his b/p at home. BP Readings from Last 3 Encounters:  10/07/17 (!) 154/88  03/10/17 (!) 158/86  11/07/16 (!) 152/98   Patient Active Problem List   Diagnosis Date Noted  . Type 2 diabetes mellitus (HCC) 11/20/2015  . Pure hypercholesterolemia 10/27/2008  . Essential (primary) hypertension 02/23/2008  . Compulsive tobacco user syndrome 03/24/2007  . ED (erectile dysfunction) of organic origin 09/23/2004    Past Medical History:  Diagnosis Date  . Back pain     Social History   Socioeconomic History  . Marital status: Married    Spouse name: Not on file  . Number of children: Not on file  . Years of education: Not on file  . Highest education level: Not on file  Social Needs  . Financial resource strain: Not on file  . Food insecurity - worry: Not on file  . Food insecurity - inability: Not on file  . Transportation needs - medical: Not on file  . Transportation needs - non-medical: Not on file  Occupational History  . Not on file  Tobacco Use  . Smoking status: Current Every Day Smoker    Packs/day: 2.00  . Smokeless tobacco: Never Used  Substance and Sexual Activity  . Alcohol use: Yes    Comment: 2 malt beers a day  . Drug use: No  . Sexual activity: Not on file  Other Topics Concern  . Not on file    Social History Narrative  . Not on file    Outpatient Encounter Medications as of 10/07/2017  Medication Sig  . amLODipine (NORVASC) 10 MG tablet TAKE 1 TABLET BY MOUTH IN THE EVENING  . hydrochlorothiazide (HYDRODIURIL) 25 MG tablet TAKE 1 TABLET (25 MG TOTAL) BY MOUTH EVERY MORNING.**NEEDS OFFICE VISIT**  . losartan (COZAAR) 100 MG tablet TAKE 1 TABLET BY MOUTH IN THE EVENING   No facility-administered encounter medications on file as of 10/07/2017.     Allergies  Allergen Reactions  . Codeine     Review of Systems  Constitutional: Negative.   HENT: Negative.   Eyes: Negative.   Respiratory: Negative.   Cardiovascular: Negative.   Musculoskeletal: Negative.   Skin: Positive for rash.  Neurological: Negative.   Endo/Heme/Allergies: Negative.   Psychiatric/Behavioral: Negative.     Objective:  BP (!) 154/88   Pulse 78   Temp 97.9 F (36.6 C)   Resp 14   Wt 208 lb (94.3 kg)   BMI 29.84 kg/m   Physical Exam  Constitutional: He is oriented to person, place, and time and well-developed, well-nourished, and in no distress.  HENT:  Head: Normocephalic and atraumatic.  Right Ear: External ear normal.  Left Ear: External ear normal.  Nose: Nose normal.  Eyes: Conjunctivae are normal. Pupils are equal, round, and reactive to light.  Cardiovascular: Normal rate, regular rhythm, normal heart sounds and intact distal pulses. Exam reveals no gallop.  No murmur heard. Pulmonary/Chest: He has wheezes (mild end expiratory wheezes).  Abdominal: Soft.  Musculoskeletal: He exhibits no edema or tenderness.  Neurological: He is alert and oriented to person, place, and time.  Skin: Skin is warm and dry. Rash noted.  Eczema lower leg-mild. Skin lesion right shoulder  Psychiatric: Mood, memory, affect and judgment normal.    Assessment and Plan :  1. Essential (primary) hypertension Still elevated. Patient has not been taking all of his medications. Refilled all 3 medications  and patient advised to make sure to take these daily. Will re check on next visit. - amLODipine (NORVASC) 10 MG tablet; Take 1 tablet (10 mg total) by mouth every evening.  Dispense: 90 tablet; Refill: 3 - hydrochlorothiazide (HYDRODIURIL) 25 MG tablet; TAKE 1 TABLET (25 MG TOTAL) BY MOUTH EVERY MORNING.  Dispense: 90 tablet; Refill: 3 - losartan (COZAAR) 100 MG tablet; Take 1 tablet (100 mg total) by mouth every evening.  Dispense: 90 tablet; Refill: 3  2. Compulsive tobacco user syndrome Discussed the importance of quitting smoking. Patient advised need to check CT chest for smokers, patient declines at this time.  3. Type 2 diabetes mellitus without complication, without long-term current use of insulin (HCC) 7.2. Worse. patient advised to really work on habits.He refuses meds. - POCT HgB A1C  4. Influenza vaccination declined 5. Colonoscopy refused 6. Colon cancer screening Pt agrees to get Cologuard done. Order faxed - Cologuard  7. Eczema of lower leg Patient wants to use OTC cream, follow as needed, mild.  8. BMI 29.0-29.9,adult Work on habits.  9. Skin lesion of right upper extremity New. Right shoulder. Advised patient to see dermatologist to evaluate. - Ambulatory referral to Dermatology  10. Pure hypercholesterolemia Recent lipid done in September 2018, due to having diabetes especially, advised patient need to treat cholesterol. Patient declines at this time.  HPI, Exam and A&P transcribed by Domingo CockingAnastasiya Hopkins, RMA under direction and in the presence of Julieanne Mansonichard Gilbert, MD. I have done the exam and reviewed the chart and it is accurate to the best of my knowledge. DentistDragon  technology has been used and  any errors in dictation or transcription are unintentional. Julieanne Mansonichard Gilbert M.D. [facility]

## 2017-11-05 ENCOUNTER — Telehealth: Payer: Self-pay | Admitting: Family Medicine

## 2017-11-05 NOTE — Telephone Encounter (Signed)
FYI--[facility] and myself have made attempts to contact pt without a return call.He was referred for skin lesions

## 2017-12-09 ENCOUNTER — Telehealth: Payer: Self-pay

## 2017-12-09 NOTE — Telephone Encounter (Signed)
LMTCB - Reminding patient to complete the Cologuard test that was sent to him before the order expires

## 2017-12-20 IMAGING — MR MR LUMBAR SPINE W/O CM
4 of 5 series · 19 of 48 positions shown · non-contrast
Comparison: None.

CLINICAL DATA: Intractable pain in the lower back. Pain radiates
down the right leg.

EXAM:
MRI LUMBAR SPINE WITHOUT CONTRAST
TECHNIQUE: Multiplanar, multisequence MR imaging of the lumbar spine was
performed. No intravenous contrast was administered.

[Series 2: T2 · sagittal · 4.0mm · 0.55mm/px · 6 of 17 slices shown (1 of 2)]
[im 1/17]
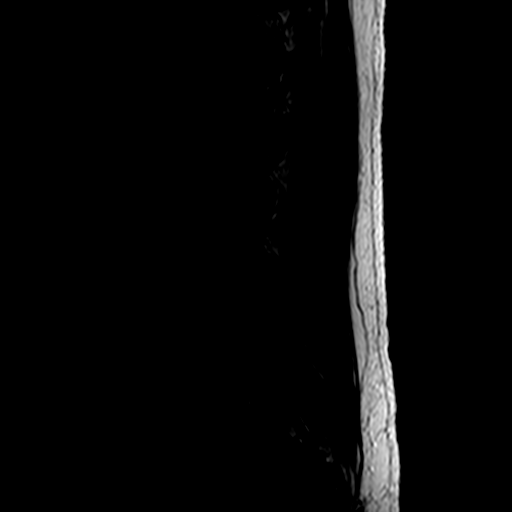
[im 4/17]
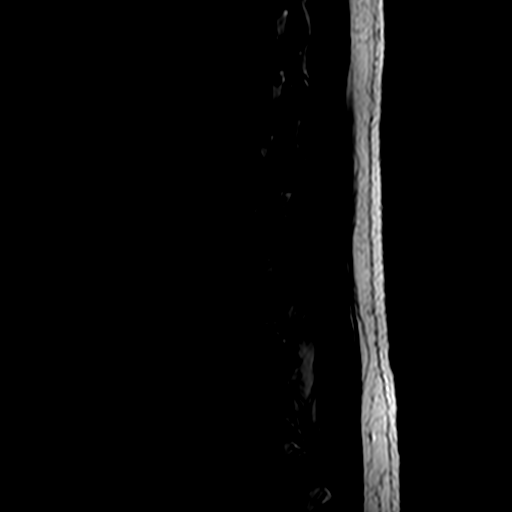
[im 7/17]
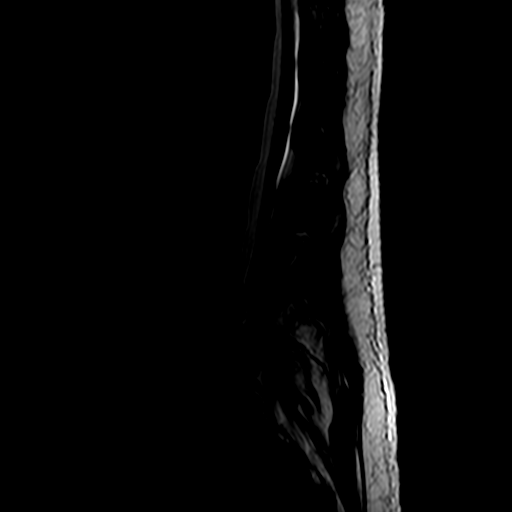
[im 10/17]
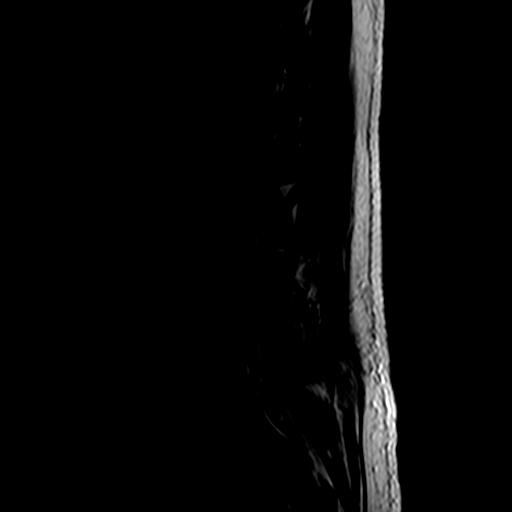
[im 13/17]
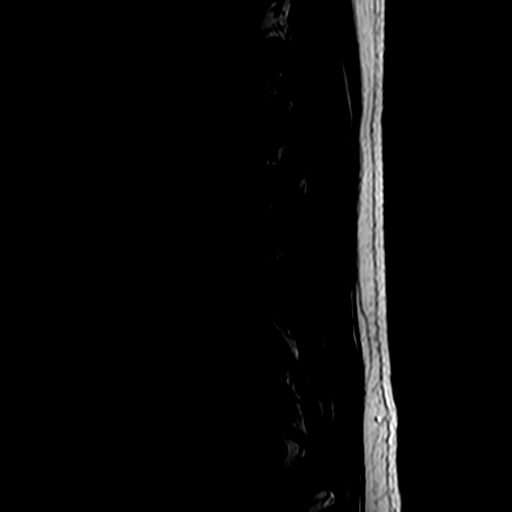
[im 17/17]
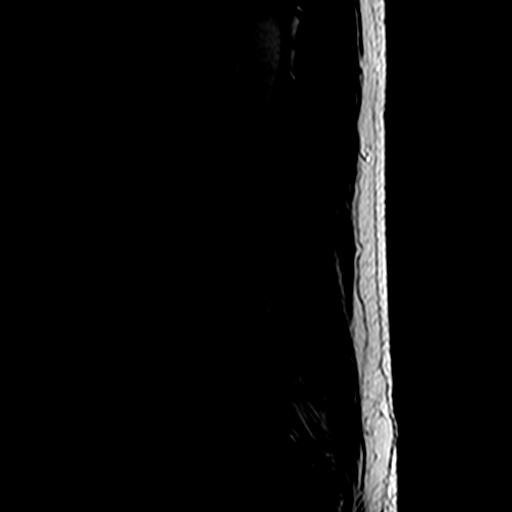

[Series 3: T1 · sagittal · 4.0mm · 0.55mm/px · 3 of 17 slices shown (1 of 2)]
[im 4/17]
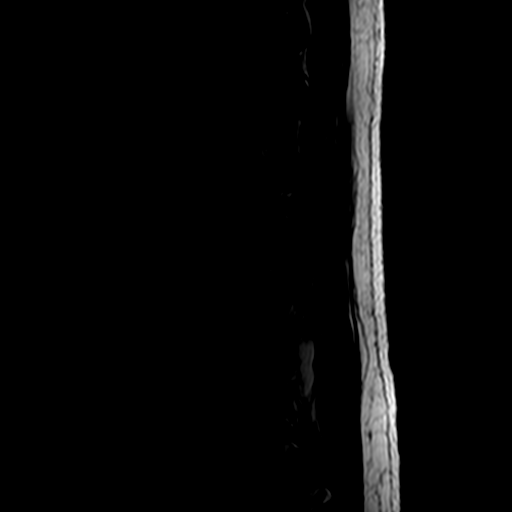
[im 10/17]
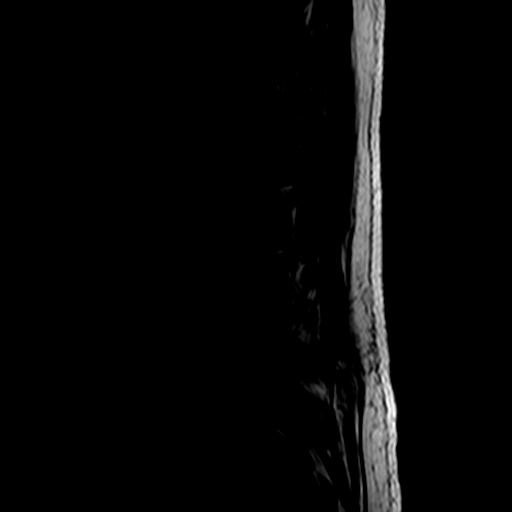
[im 17/17]
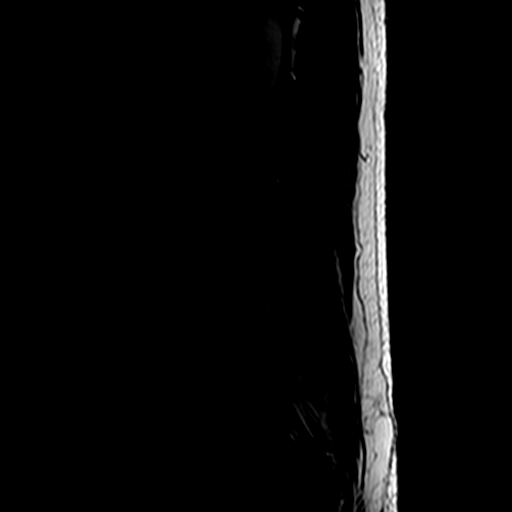

[Series 5: T2 · axial · 4.0mm · 0.39mm/px · z∈[-105,+95]mm · 7 of 41 slices shown (2 of 2)]
[im 1/41]
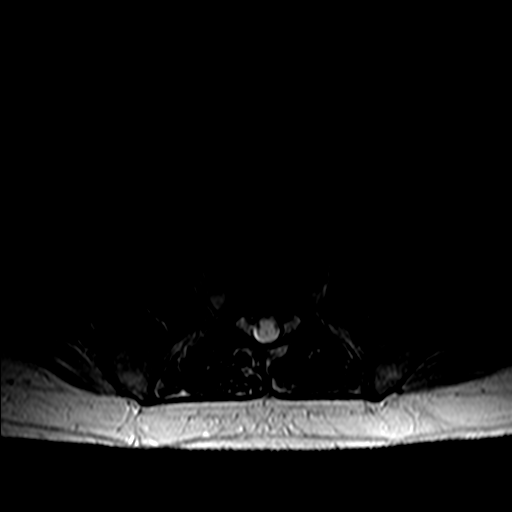
[im 6/41]
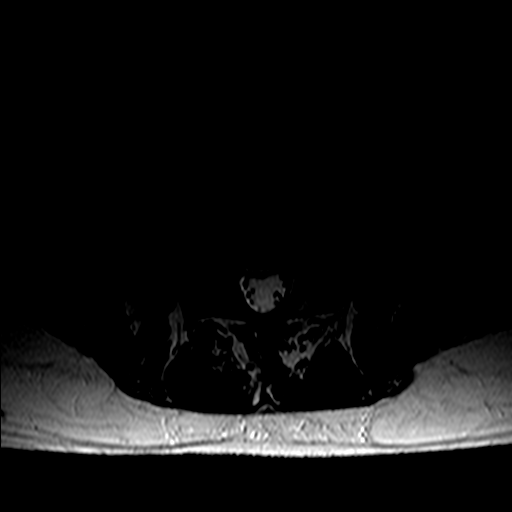
[im 12/41]
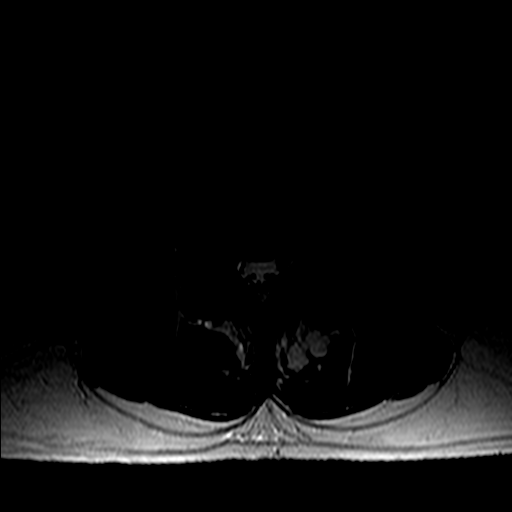
[im 18/41]
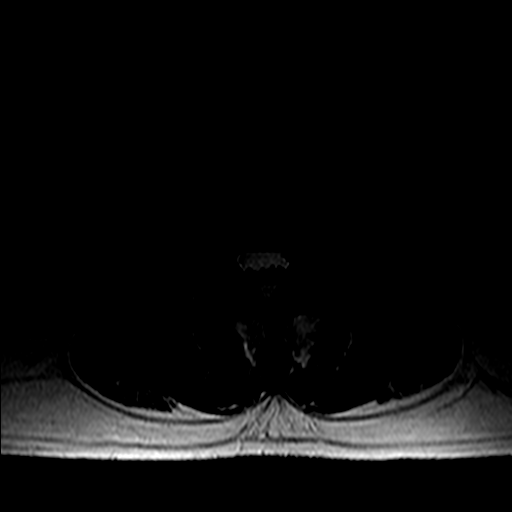
[im 21/41]
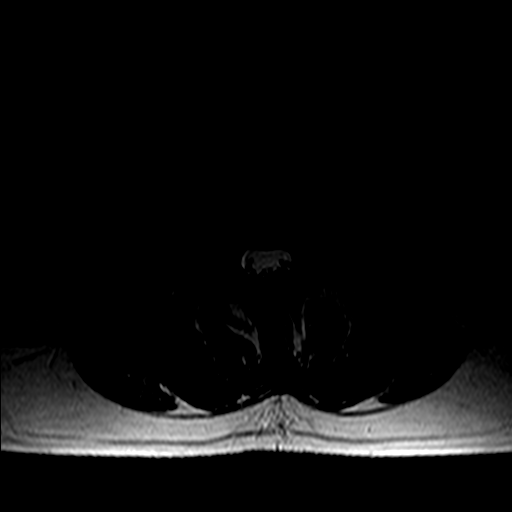
[im 23/41]
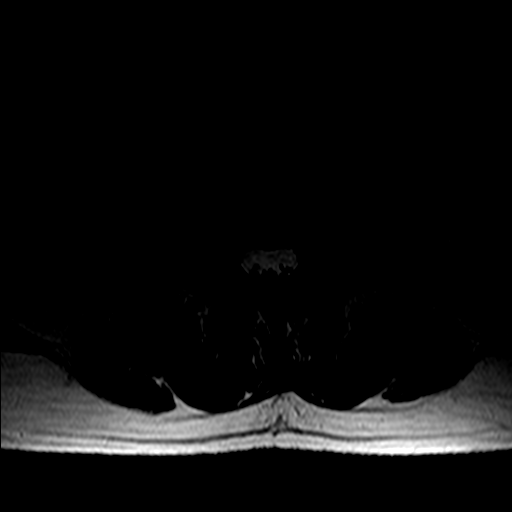
[im 35/41]
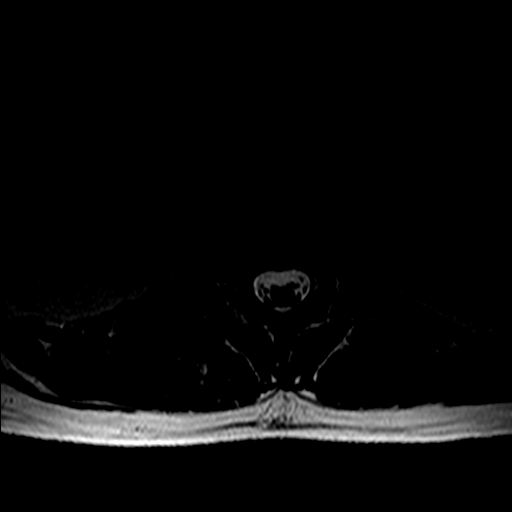

[Series 6: T1 · axial · 4.0mm · 0.39mm/px · z∈[-80,+95]mm · 3 of 41 slices shown (2 of 2)]
[im 6/41]
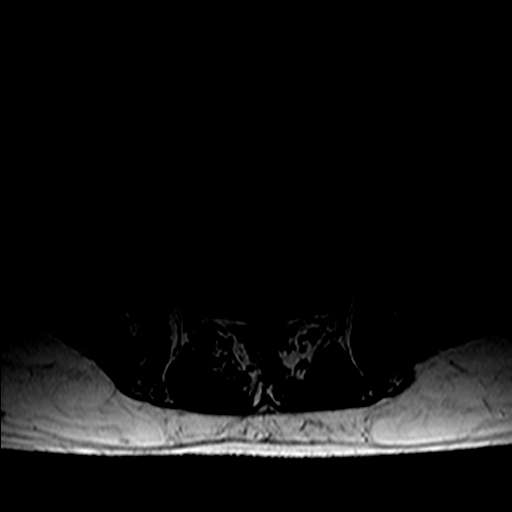
[im 21/41]
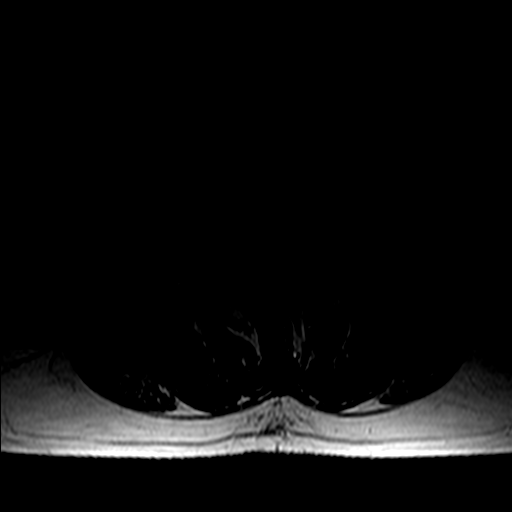
[im 35/41]
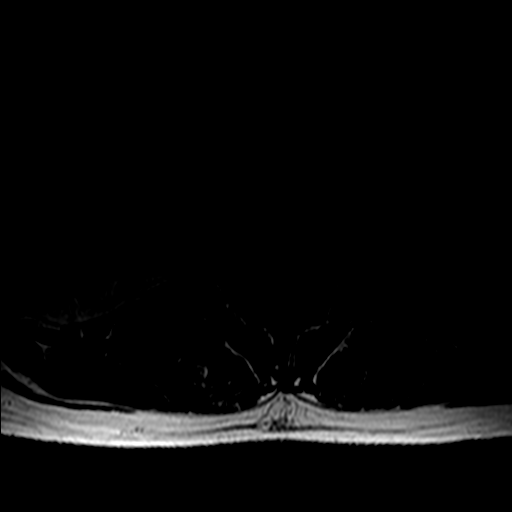

[19 of 48 positions shown; findings below may reference images not displayed]

FINDINGS: Segmentation:  Standard.

Alignment:  Physiologic.

Vertebrae: No fracture, evidence of discitis, or bone lesion. Marrow
edema in the left L5 pedicle and articular processes likely reactive
to facet arthropathy. No visible pars defect. Small hemangioma
centered in the L2 left pedicle

Conus medullaris: Extends to the L2 level and appears normal.

Paraspinal and other soft tissues: Negative

Disc levels:

T12- L1: Unremarkable.

L1-L2: Mild disc narrowing and annulus bulging. No herniation or
impingement

L2-L3: Unremarkable.

L3-L4: Mild disc narrowing with bulging. Early facet spurring. No
impingement

L4-L5: Facet arthropathy with moderate degenerative disc narrowing.
Disc bulging. Foraminal and ventral subarachnoid crowding without
impingement. Facet arthropathy with marrow edema on the left.

L5-S1:Right foraminal disc extrusion compressing the L5 nerve. The
disc is moderately narrowed. Mild facet arthropathy.
IMPRESSION: 1. L5-S1 right foraminal herniation with L5 compression.
2. Facet arthropathy with marrow edema on the left at L4-5.

## 2018-03-04 ENCOUNTER — Encounter: Payer: Self-pay | Admitting: Family Medicine

## 2018-03-04 ENCOUNTER — Ambulatory Visit (INDEPENDENT_AMBULATORY_CARE_PROVIDER_SITE_OTHER): Payer: BLUE CROSS/BLUE SHIELD | Admitting: Family Medicine

## 2018-03-04 VITALS — BP 140/84 | HR 88 | Temp 97.7°F | Resp 16 | Wt 201.0 lb

## 2018-03-04 DIAGNOSIS — F17200 Nicotine dependence, unspecified, uncomplicated: Secondary | ICD-10-CM | POA: Diagnosis not present

## 2018-03-04 DIAGNOSIS — I10 Essential (primary) hypertension: Secondary | ICD-10-CM

## 2018-03-04 DIAGNOSIS — Z1211 Encounter for screening for malignant neoplasm of colon: Secondary | ICD-10-CM | POA: Diagnosis not present

## 2018-03-04 DIAGNOSIS — E119 Type 2 diabetes mellitus without complications: Secondary | ICD-10-CM

## 2018-03-04 LAB — POCT GLYCOSYLATED HEMOGLOBIN (HGB A1C)
Est. average glucose Bld gHb Est-mCnc: 169
Hemoglobin A1C: 7.5 % — AB (ref 4.0–5.6)

## 2018-03-04 MED ORDER — METFORMIN HCL 1000 MG PO TABS: 1000.0000 mg | ORAL_TABLET | Freq: Every day | ORAL | 3 refills | Status: DC

## 2018-03-04 NOTE — Progress Notes (Signed)
Patient: Colin LinemanCharles R Jackson Male    DOB: 03/29/1956   62 y.o.   MRN: [id number] Visit Date: 03/04/2018  Today's Provider: Megan Mansichard Gilbert Jr, MD   Chief Complaint  Patient presents with  . Hypertension  . Diabetes   Subjective:    HPI Pt is here today for a follow up of chronic illness. He is not checking his BP or his blood sugars at home. He reports that is feeling well. He is taking his BP medication. He is willing to complete the Cologuard now. Pt has not concerns today.   Multiple issues brought up but pt wants minimal intervention.    Allergies  Allergen Reactions  . Codeine      Current Outpatient Medications:  .  amLODipine (NORVASC) 10 MG tablet, Take 1 tablet (10 mg total) by mouth every evening., Disp: 90 tablet, Rfl: 3 .  hydrochlorothiazide (HYDRODIURIL) 25 MG tablet, TAKE 1 TABLET (25 MG TOTAL) BY MOUTH EVERY MORNING., Disp: 90 tablet, Rfl: 3 .  losartan (COZAAR) 100 MG tablet, Take 1 tablet (100 mg total) by mouth every evening., Disp: 90 tablet, Rfl: 3  Review of Systems  Constitutional: Negative.   HENT: Negative.   Eyes: Negative.   Respiratory: Negative.   Cardiovascular: Negative.   Gastrointestinal: Negative.   Endocrine: Negative.   Genitourinary: Negative.   Musculoskeletal: Negative.   Skin: Negative.   Allergic/Immunologic: Negative.   Neurological: Negative.   Hematological: Negative.   Psychiatric/Behavioral: Negative.     Social History   Tobacco Use  . Smoking status: Current Every Day Smoker    Packs/day: 2.00  . Smokeless tobacco: Never Used  Substance Use Topics  . Alcohol use: Yes    Comment: 2 malt beers a day   Objective:   BP 140/84 (BP Location: Left Arm, Patient Position: Sitting, Cuff Size: Normal)   Pulse 88   Temp 97.7 F (36.5 C) (Oral)   Resp 16   Wt 201 lb (91.2 kg)   SpO2 97%   BMI 28.84 kg/m  Vitals:   03/04/18 1150  BP: 140/84  Pulse: 88  Resp: 16  Temp: 97.7 F (36.5 C)  TempSrc: Oral  SpO2:  97%  Weight: 201 lb (91.2 kg)     Physical Exam  Constitutional: He is oriented to person, place, and time. He appears well-developed and well-nourished.  HENT:  Head: Normocephalic and atraumatic.  Right Ear: External ear normal.  Left Ear: External ear normal.  Nose: Nose normal.  Eyes: No scleral icterus.  Neck: No thyromegaly present.  Cardiovascular: Normal rate, regular rhythm, normal heart sounds and intact distal pulses.  Pulmonary/Chest: Effort normal and breath sounds normal.  Abdominal: Soft.  Neurological: He is alert and oriented to person, place, and time.  Skin: Skin is warm and dry.  Psychiatric: He has a normal mood and affect. His behavior is normal. Judgment and thought content normal.        Assessment & Plan:     1. Type 2 diabetes mellitus without complication, without long-term current use of insulin (HCC) Pt declines any further meds. He says he will work on lifestyle. - POCT HgB A1C--7.5 today. - metFORMIN (GLUCOPHAGE) 1000 MG tablet; Take 1 tablet (1,000 mg total) by mouth daily with breakfast.  Dispense: 180 tablet; Refill: 3  2. Essential (primary) hypertension   3. Compulsive tobacco user syndrome Pt advised to quit.  4. Colon cancer screening  - Cologuard 5.HLD Would treat but pt declines.  I have done the exam and reviewed the above chart and it is accurate to the best of my knowledge. Development worker, community has been used in this note in any air is in the dictation or transcription are unintentional.   Wilhemena Durie, MD  [facility]

## 2018-03-11 ENCOUNTER — Telehealth: Payer: Self-pay | Admitting: Family Medicine

## 2018-03-11 NOTE — Telephone Encounter (Signed)
Order for cologuard faxed to [facility] °

## 2018-07-08 ENCOUNTER — Ambulatory Visit: Payer: BLUE CROSS/BLUE SHIELD | Admitting: Family Medicine

## 2018-07-08 VITALS — BP 167/117 | HR 85 | Temp 98.6°F | Resp 18 | Wt 203.0 lb

## 2018-07-08 DIAGNOSIS — I10 Essential (primary) hypertension: Secondary | ICD-10-CM | POA: Diagnosis not present

## 2018-07-08 DIAGNOSIS — E119 Type 2 diabetes mellitus without complications: Secondary | ICD-10-CM | POA: Diagnosis not present

## 2018-07-08 DIAGNOSIS — Z9111 Patient's noncompliance with dietary regimen: Secondary | ICD-10-CM

## 2018-07-08 DIAGNOSIS — Z91199 Patient's noncompliance with other medical treatment and regimen due to unspecified reason: Secondary | ICD-10-CM

## 2018-07-08 DIAGNOSIS — N529 Male erectile dysfunction, unspecified: Secondary | ICD-10-CM | POA: Diagnosis not present

## 2018-07-08 DIAGNOSIS — E7800 Pure hypercholesterolemia, unspecified: Secondary | ICD-10-CM | POA: Diagnosis not present

## 2018-07-08 DIAGNOSIS — Z720 Tobacco use: Secondary | ICD-10-CM

## 2018-07-08 MED ORDER — METFORMIN HCL 1000 MG PO TABS: 1000.0000 mg | ORAL_TABLET | Freq: Every day | ORAL | 3 refills | Status: DC

## 2018-07-08 NOTE — Progress Notes (Signed)
Colin Jackson  MRN: [id number] DOB: Aug 02, 1956  Subjective:  HPI   The patient is a 62 year old male who presents for follow up of his blood pressure.  He states he had run out of his medicine last week and just started back on it yesterday.  He states he checks his blood pressure outside of the office but has not done so in some time.  Diabetes-the patient had his last A1C on 03/04/18 was 7.5.  The patient states that he has never started the Metformin.   Pt historically noncompliant.  Patient Active Problem List   Diagnosis Date Noted  . Type 2 diabetes mellitus (HCC) 11/20/2015  . Pure hypercholesterolemia 10/27/2008  . Essential (primary) hypertension 02/23/2008  . Compulsive tobacco user syndrome 03/24/2007  . ED (erectile dysfunction) of organic origin 09/23/2004    Past Medical History:  Diagnosis Date  . Back pain     Social History   Socioeconomic History  . Marital status: Married    Spouse name: Not on file  . Number of children: Not on file  . Years of education: Not on file  . Highest education level: Not on file  Occupational History  . Not on file  Social Needs  . Financial resource strain: Not on file  . Food insecurity:    Worry: Not on file    Inability: Not on file  . Transportation needs:    Medical: Not on file    Non-medical: Not on file  Tobacco Use  . Smoking status: Current Every Day Smoker    Packs/day: 2.00  . Smokeless tobacco: Never Used  Substance and Sexual Activity  . Alcohol use: Yes    Comment: 2 malt beers a day  . Drug use: No  . Sexual activity: Not on file  Lifestyle  . Physical activity:    Days per week: Not on file    Minutes per session: Not on file  . Stress: Not on file  Relationships  . Social connections:    Talks on phone: Not on file    Gets together: Not on file    Attends religious service: Not on file    Active member of club or organization: Not on file    Attends meetings of clubs or organizations:  Not on file    Relationship status: Not on file  . Intimate partner violence:    Fear of current or ex partner: Not on file    Emotionally abused: Not on file    Physically abused: Not on file    Forced sexual activity: Not on file  Other Topics Concern  . Not on file  Social History Narrative  . Not on file    Outpatient Encounter Medications as of 07/08/2018  Medication Sig  . amLODipine (NORVASC) 10 MG tablet Take 1 tablet (10 mg total) by mouth every evening.  . hydrochlorothiazide (HYDRODIURIL) 25 MG tablet TAKE 1 TABLET (25 MG TOTAL) BY MOUTH EVERY MORNING.  Marland Kitchen losartan (COZAAR) 100 MG tablet Take 1 tablet (100 mg total) by mouth every evening.  . metFORMIN (GLUCOPHAGE) 1000 MG tablet Take 1 tablet (1,000 mg total) by mouth daily with breakfast. (Patient not taking: Reported on 07/08/2018)   No facility-administered encounter medications on file as of 07/08/2018.     Allergies  Allergen Reactions  . Codeine     Review of Systems  Constitutional: Negative.   Eyes: Negative.   Respiratory: Negative.   Cardiovascular: Negative.   Gastrointestinal:  Negative.   Skin: Negative.   Endo/Heme/Allergies: Negative.   Psychiatric/Behavioral: Negative.     Objective:  BP (!) 167/117 (BP Location: Right Arm, Patient Position: Sitting, Cuff Size: Normal)   Pulse 85   Temp 98.6 F (37 C) (Oral)   Resp 18   Wt 203 lb (92.1 kg)   BMI 29.13 kg/m    First BP reading was 170/110   Physical Exam  Constitutional: He is oriented to person, place, and time and well-developed, well-nourished, and in no distress.  HENT:  Head: Normocephalic and atraumatic.  Eyes: Conjunctivae are normal. No scleral icterus.  Neck: No thyromegaly present.  Cardiovascular: Normal rate, regular rhythm and normal heart sounds.  Pulmonary/Chest: Effort normal and breath sounds normal.  Abdominal: Soft.  Musculoskeletal: He exhibits no edema.  Neurological: He is alert and oriented to person, place,  and time. Gait normal. GCS score is 15.  Skin: Skin is warm and dry.  Psychiatric: Mood, memory, affect and judgment normal.    Assessment and Plan :   1. Essential (primary) hypertension   2. Type 2 diabetes mellitus without complication, without long-term current use of insulin (HCC)  - Hemoglobin A1c - metFORMIN (GLUCOPHAGE) 1000 MG tablet; Take 1 tablet (1,000 mg total) by mouth daily with breakfast.  Dispense: 180 tablet; Refill: 3  3. Pure hypercholesterolemia   4. ED (erectile dysfunction) of organic origin   5. Tobacco abuse   6. Noncompliance with therapeutic plan  I have done the exam and reviewed the chart and it is accurate to the best of my knowledge. Dentist has been used and  any errors in dictation or transcription are unintentional. Julieanne Manson M.D. [facility]

## 2018-10-08 ENCOUNTER — Ambulatory Visit: Payer: BLUE CROSS/BLUE SHIELD | Admitting: Family Medicine

## 2018-10-08 VITALS — BP 142/100 | HR 91 | Temp 98.1°F | Resp 98 | Wt 194.0 lb

## 2018-10-08 DIAGNOSIS — I10 Essential (primary) hypertension: Secondary | ICD-10-CM

## 2018-10-08 DIAGNOSIS — E119 Type 2 diabetes mellitus without complications: Secondary | ICD-10-CM | POA: Diagnosis not present

## 2018-10-08 DIAGNOSIS — E7800 Pure hypercholesterolemia, unspecified: Secondary | ICD-10-CM

## 2018-10-08 DIAGNOSIS — F17200 Nicotine dependence, unspecified, uncomplicated: Secondary | ICD-10-CM | POA: Diagnosis not present

## 2018-10-08 LAB — POCT GLYCOSYLATED HEMOGLOBIN (HGB A1C): Hemoglobin A1C: 9.8 % — AB (ref 4.0–5.6)

## 2018-10-08 MED ORDER — METFORMIN HCL 1000 MG PO TABS
1000.0000 mg | ORAL_TABLET | Freq: Every day | ORAL | 3 refills | Status: DC
Start: 2018-10-08 — End: 2019-11-03

## 2018-10-08 NOTE — Progress Notes (Signed)
Colin Jackson  MRN: [id number] DOB: Sep 11, 1956  Subjective:  HPI   The patient is a 63 year old male who presents for follow up of his hypertension and diabetes.  He was last seen on 07/08/18.  Hypertension-no changes were made at his last visit.  The patient is currently on Amlodipine, HCTZ and Losartan.  He states that he takes his medicine regularly during the week but on the weekend he sometimes misses some.  He states he does not check his BP outside of the office.  Diabetes-the patient had his last A1C done on 06/25/18 and it was 8.6.  This was up from his June test which was 7.5.  The patient was prescribed Metformin for his glucose.  However, he states that he is not taking it and didn't actually even get it .  Patient Active Problem List   Diagnosis Date Noted  . Type 2 diabetes mellitus (HCC) 11/20/2015  . Pure hypercholesterolemia 10/27/2008  . Essential (primary) hypertension 02/23/2008  . Compulsive tobacco user syndrome 03/24/2007  . ED (erectile dysfunction) of organic origin 09/23/2004    Past Medical History:  Diagnosis Date  . Back pain     Social History   Socioeconomic History  . Marital status: Married    Spouse name: Not on file  . Number of children: Not on file  . Years of education: Not on file  . Highest education level: Not on file  Occupational History  . Not on file  Social Needs  . Financial resource strain: Not on file  . Food insecurity:    Worry: Not on file    Inability: Not on file  . Transportation needs:    Medical: Not on file    Non-medical: Not on file  Tobacco Use  . Smoking status: Current Every Day Smoker    Packs/day: 2.00  . Smokeless tobacco: Never Used  Substance and Sexual Activity  . Alcohol use: Yes    Comment: 2 malt beers a day  . Drug use: No  . Sexual activity: Not on file  Lifestyle  . Physical activity:    Days per week: Not on file    Minutes per session: Not on file  . Stress: Not on file    Relationships  . Social connections:    Talks on phone: Not on file    Gets together: Not on file    Attends religious service: Not on file    Active member of club or organization: Not on file    Attends meetings of clubs or organizations: Not on file    Relationship status: Not on file  . Intimate partner violence:    Fear of current or ex partner: Not on file    Emotionally abused: Not on file    Physically abused: Not on file    Forced sexual activity: Not on file  Other Topics Concern  . Not on file  Social History Narrative  . Not on file    Outpatient Encounter Medications as of 10/08/2018  Medication Sig  . amLODipine (NORVASC) 10 MG tablet Take 1 tablet (10 mg total) by mouth every evening.  . hydrochlorothiazide (HYDRODIURIL) 25 MG tablet TAKE 1 TABLET (25 MG TOTAL) BY MOUTH EVERY MORNING.  Marland Kitchen losartan (COZAAR) 100 MG tablet Take 1 tablet (100 mg total) by mouth every evening.  . metFORMIN (GLUCOPHAGE) 1000 MG tablet Take 1 tablet (1,000 mg total) by mouth daily with breakfast. (Patient not taking: Reported on 10/08/2018)  No facility-administered encounter medications on file as of 10/08/2018.     Allergies  Allergen Reactions  . Codeine     Review of Systems  Constitutional: Negative for fever and malaise/fatigue.  HENT: Negative.   Eyes: Negative.   Respiratory: Positive for cough (smokers cough). Negative for shortness of breath and wheezing.   Cardiovascular: Negative for chest pain, palpitations, orthopnea, claudication and leg swelling.  Gastrointestinal: Negative.   Genitourinary: Negative for frequency.  Skin: Negative.   Neurological: Negative.   Endo/Heme/Allergies: Negative for polydipsia.  Psychiatric/Behavioral: Negative.     Objective:  BP (!) 142/100 (BP Location: Right Arm, Patient Position: Sitting, Cuff Size: Normal)   Pulse 91   Temp 98.1 F (36.7 C) (Oral)   Resp (!) 98   Wt 194 lb (88 kg)   HC 16" (40.6 cm)   BMI 27.84 kg/m    Physical Exam  Constitutional: He is oriented to person, place, and time and well-developed, well-nourished, and in no distress.  HENT:  Head: Normocephalic and atraumatic.  Right Ear: External ear normal.  Left Ear: External ear normal.  Nose: Nose normal.  Eyes: Pupils are equal, round, and reactive to light. Conjunctivae and EOM are normal.  Neck: Normal range of motion. Neck supple.  Cardiovascular: Normal rate, regular rhythm, normal heart sounds and intact distal pulses.  Pulmonary/Chest: Effort normal and breath sounds normal.  Abdominal: Soft.  Musculoskeletal: Normal range of motion.  Neurological: He is alert and oriented to person, place, and time. He has normal reflexes. Gait normal. GCS score is 15.  Skin: Skin is warm and dry.  Psychiatric: Mood, memory, affect and judgment normal.    Assessment and Plan :   1. Type 2 diabetes mellitus without complication, without long-term current use of insulin (HCC) Hold.  Patient agrees to start metformin which he never started in the past.  He will follow-up in 3 to 4 months.  I stressed to him that I thought he needed more treatment but he declines. - POCT glycosylated hemoglobin (Hb A1C)--9.8 today - metFORMIN (GLUCOPHAGE) 1000 MG tablet; Take 1 tablet (1,000 mg total) by mouth daily with breakfast.  Dispense: 180 tablet; Refill: 3  2. Essential (primary) hypertension Again poor control.  Patient states he will work on his habits.  3. Compulsive tobacco user syndrome Patient is he wants to try to quit this year some time.  4. Pure hypercholesterolemia Declines treatment.  Aloe up in 4 months.    HPI, Exam and A&P Transcribed under the direction and in the presence of Julieanne Manson, Montez Hageman., MD. Electronically Signed: Janey Greaser, RMA I have done the exam and reviewed the chart and it is accurate to the best of my knowledge. Dentist has been used and  any errors in dictation or transcription are  unintentional. Julieanne Manson M.D. [facility]

## 2018-11-08 ENCOUNTER — Other Ambulatory Visit: Payer: Self-pay | Admitting: Family Medicine

## 2018-11-08 DIAGNOSIS — I10 Essential (primary) hypertension: Secondary | ICD-10-CM

## 2018-11-09 NOTE — Telephone Encounter (Signed)
Pharmacy requesting refills. Thanks!  

## 2018-11-10 NOTE — Telephone Encounter (Signed)
Pt calling for refills on medications.  Thanks, [employer]

## 2019-02-04 ENCOUNTER — Ambulatory Visit: Payer: Self-pay | Admitting: Family Medicine

## 2019-03-24 ENCOUNTER — Ambulatory Visit: Payer: Self-pay | Admitting: Family Medicine

## 2019-11-03 ENCOUNTER — Other Ambulatory Visit: Payer: Self-pay | Admitting: Family Medicine

## 2019-11-03 DIAGNOSIS — E119 Type 2 diabetes mellitus without complications: Secondary | ICD-10-CM

## 2019-12-14 ENCOUNTER — Other Ambulatory Visit: Payer: Self-pay | Admitting: Family Medicine

## 2019-12-14 DIAGNOSIS — I10 Essential (primary) hypertension: Secondary | ICD-10-CM

## 2019-12-14 NOTE — Telephone Encounter (Signed)
LOV: 10/08/18

## 2019-12-14 NOTE — Telephone Encounter (Signed)
Copied from CRM [id number]. Topic: Quick Communication - Rx Refill/Question >> Dec 14, 2019 10:29 AM Dalphine Handing A wrote: Medication: amLODipine (NORVASC) 10 MG tablet,hydrochlorothiazide (HYDRODIURIL) 25 MG tablet ,losartan (COZAAR) 100 MG tablet [  Has the patient contacted their pharmacy? Yes (Agent: If no, request that the patient contact the pharmacy for the refill.) (Agent: If yes, when and what did the pharmacy advise?)Contact PCP  Preferred Pharmacy (with phone number or street name): [id number] - [place], [place] [address]  Phone:  [phone] Fax:  [phone]     Agent: Please be advised that RX refills may take up to 3 business days. We ask that you follow-up with your pharmacy.

## 2019-12-14 NOTE — Telephone Encounter (Signed)
Requested medication (s) are due for refill today:   Yes  Requested medication (s) are on the active medication list:   Yes  Future visit scheduled:   No   Last ordered: 11/11/2018  #90  3 refills.  Clinic note:   I attempted to reach him to make an appt for his refills however the number listed is his work number and they said he has retired from there.     Requested Prescriptions  Pending Prescriptions Disp Refills   losartan (COZAAR) 100 MG tablet [Pharmacy Med Name: LOSARTAN POTASSIUM 100 MG TAB] 90 tablet 3    Sig: TAKE 1 TABLET (100 MG TOTAL) BY MOUTH EVERY EVENING.      Cardiovascular:  Angiotensin Receptor Blockers Failed - 12/14/2019  9:36 AM      Failed - Cr in normal range and within 180 days    Creatinine  Date Value Ref Range Status  06/19/2017 0.8 0.6 - 1.3 Final          Failed - K in normal range and within 180 days    Potassium  Date Value Ref Range Status  06/19/2017 4.7 3.4 - 5.3 Final          Failed - Last BP in normal range    BP Readings from Last 1 Encounters:  10/08/18 (!) 142/100          Failed - Valid encounter within last 6 months    Recent Outpatient Visits           1 year ago Type 2 diabetes mellitus without complication, without long-term current use of insulin ([place])   [facility] Jerrol Banana., MD   1 year ago Essential (primary) hypertension   [facility] Jerrol Banana., MD   1 year ago Type 2 diabetes mellitus without complication, without long-term current use of insulin [facility])   [facility] Jerrol Banana., MD   2 years ago Essential (primary) hypertension   [facility] Jerrol Banana., MD   2 years ago Essential (primary) hypertension   [facility] Jerrol Banana., MD              Passed - Patient is not pregnant        amLODipine (NORVASC) 10 MG tablet [Pharmacy Med Name: AMLODIPINE BESYLATE 10 MG TAB] 90  tablet 3    Sig: TAKE 1 TABLET BY MOUTH EVERY DAY IN THE EVENING      Cardiovascular:  Calcium Channel Blockers Failed - 12/14/2019  9:36 AM      Failed - Last BP in normal range    BP Readings from Last 1 Encounters:  10/08/18 (!) 142/100          Failed - Valid encounter within last 6 months    Recent Outpatient Visits           1 year ago Type 2 diabetes mellitus without complication, without long-term current use of insulin [facility])   [facility] Jerrol Banana., MD   1 year ago Essential (primary) hypertension   [facility] Jerrol Banana., MD   1 year ago Type 2 diabetes mellitus without complication, without long-term current use of insulin [facility])   [facility] Jerrol Banana., MD   2 years ago Essential (primary) hypertension   [facility] Jerrol Banana., MD   2 years ago Essential (primary) hypertension  [facility] Lone, Leonette Monarch., MD                hydrochlorothiazide (HYDRODIURIL) 25 MG tablet [Pharmacy Med Name: HYDROCHLOROTHIAZIDE 25 MG TAB] 90 tablet 3    Sig: TAKE 1 TABLET BY MOUTH EVERY DAY IN THE MORNING      Cardiovascular: Diuretics - Thiazide Failed - 12/14/2019  9:36 AM      Failed - Ca in normal range and within 360 days    No results found for: CALCIUM, CORRECTEDCA, CAWHOLEBLD, POCCA, CALION, CAION, POCICA        Failed - Cr in normal range and within 360 days    Creatinine  Date Value Ref Range Status  06/19/2017 0.8 0.6 - 1.3 Final          Failed - K in normal range and within 360 days    Potassium  Date Value Ref Range Status  06/19/2017 4.7 3.4 - 5.3 Final          Failed - Na in normal range and within 360 days    Sodium  Date Value Ref Range Status  06/19/2017 140 137 - 147 Final          Failed - Last BP in normal range    BP Readings from Last 1 Encounters:  10/08/18 (!) 142/100          Failed - Valid encounter  within last 6 months    Recent Outpatient Visits           1 year ago Type 2 diabetes mellitus without complication, without long-term current use of insulin [facility])   [facility] Maple Hudson., MD   1 year ago Essential (primary) hypertension   [facility] Maple Hudson., MD   1 year ago Type 2 diabetes mellitus without complication, without long-term current use of insulin [facility])   [facility] Maple Hudson., MD   2 years ago Essential (primary) hypertension   [facility] Maple Hudson., MD   2 years ago Essential (primary) hypertension   [facility] Maple Hudson., MD

## 2019-12-14 NOTE — Telephone Encounter (Signed)
Number in patients chart is not current. Spoke with patient's wife about patient needing a follow-up appointment and she scheduled one for 12/16/2019 @ 10:40 AM for patient. Medication was send into pharmacy.

## 2019-12-15 NOTE — Progress Notes (Signed)
Patient: Colin Jackson Male    DOB: 1956-09-09   64 y.o.   MRN: [id number] Visit Date: 12/16/2019  Today's Provider: Megan Mans, MD   Chief Complaint  Patient presents with  . Follow-up  . Diabetes  . Hypertension  . Hyperlipidemia   Subjective:     HPI  Patient ran out of his medication at least 2 weeks ago.  He states as long as he takes that he does fine.  He continues to smoke and has no desire to quit. He is married and this is a second marriage.  He has 2 children and 5 grandchildren.  He has no complaints today. Type 2 diabetes mellitus without complication, without long-term current use of insulin (HCC) From 10/08/2018-Hold. Patient agrees to start metformin which he never started in the past.  He will follow-up in 3 to 4 months.  I stressed to him that I thought he needed more treatment but he declines. Hemoglobin A1C--9.8.  Essential (primary) hypertension From 10/08/2018-Again poor control.  Patient states he will work on his habits.  Pure hypercholesterolemia From 10/08/2018-Declines treatment.  Follow up in 4 months.    Allergies  Allergen Reactions  . Codeine      Current Outpatient Medications:  .  amLODipine (NORVASC) 10 MG tablet, TAKE 1 TABLET BY MOUTH EVERY DAY IN THE EVENING, Disp: 90 tablet, Rfl: 0 .  hydrochlorothiazide (HYDRODIURIL) 25 MG tablet, TAKE 1 TABLET BY MOUTH EVERY DAY IN THE MORNING, Disp: 90 tablet, Rfl: 0 .  losartan (COZAAR) 100 MG tablet, TAKE 1 TABLET (100 MG TOTAL) BY MOUTH EVERY EVENING., Disp: 90 tablet, Rfl: 0 .  metFORMIN (GLUCOPHAGE) 1000 MG tablet, TAKE 1 TABLET BY MOUTH EVERY DAY WITH BREAKFAST, Disp: 90 tablet, Rfl: 0  Review of Systems  Constitutional: Negative for appetite change, chills and fever.  HENT: Negative.   Eyes: Negative.   Respiratory: Negative for chest tightness, shortness of breath and wheezing.   Cardiovascular: Negative for chest pain and palpitations.  Gastrointestinal: Negative for  abdominal pain, nausea and vomiting.  Allergic/Immunologic: Negative.   Neurological: Negative.   Hematological: Negative.   Psychiatric/Behavioral: Negative.     Social History   Tobacco Use  . Smoking status: Current Every Day Smoker    Packs/day: 2.00  . Smokeless tobacco: Never Used  Substance Use Topics  . Alcohol use: Yes    Comment: 2 malt beers a day      Objective:   BP (!) 192/129 (BP Location: Right Arm, Patient Position: Sitting, Cuff Size: Large)   Pulse 91   Temp (!) 97.5 F (36.4 C) (Other (Comment))   Resp 16   Ht 5\' 10"  (1.778 m)   Wt 192 lb (87.1 kg)   SpO2 98%   BMI 27.55 kg/m  Vitals:   12/16/19 1049  BP: (!) 192/129  Pulse: 91  Resp: 16  Temp: (!) 97.5 F (36.4 C)  TempSrc: Other (Comment)  SpO2: 98%  Weight: 192 lb (87.1 kg)  Height: 5\' 10"  (1.778 m)  Body mass index is 27.55 kg/m.   Physical Exam Vitals reviewed.  Constitutional:      Appearance: He is well-developed.  HENT:     Head: Normocephalic and atraumatic.     Right Ear: External ear normal.     Left Ear: External ear normal.     Nose: Nose normal.  Eyes:     General: No scleral icterus. Neck:     Thyroid: No  thyromegaly.  Cardiovascular:     Rate and Rhythm: Normal rate and regular rhythm.     Heart sounds: Normal heart sounds.  Pulmonary:     Effort: Pulmonary effort is normal.     Breath sounds: Normal breath sounds.  Abdominal:     Palpations: Abdomen is soft.  Skin:    General: Skin is warm and dry.  Neurological:     Mental Status: He is alert and oriented to person, place, and time.  Psychiatric:        Behavior: Behavior normal.        Thought Content: Thought content normal.        Judgment: Judgment normal.      No results found for any visits on 12/16/19.     Assessment & Plan     1. Essential (primary) hypertension Patient will take his medication but that is about all he will do.  Follow-up in 2 to 3 months.  He feels fine and has no  complaints.  His blood pressure is terrible today but he states that when he takes his medications it is fine. - amLODipine (NORVASC) 10 MG tablet; TAKE 1 TABLET BY MOUTH EVERY DAY IN THE EVENING  Dispense: 90 tablet; Refill: 1 - hydrochlorothiazide (HYDRODIURIL) 25 MG tablet; Take 1 tablet (25 mg total) by mouth every morning.  Dispense: 90 tablet; Refill: 1 - losartan (COZAAR) 100 MG tablet; Take 1 tablet (100 mg total) by mouth every evening.  Dispense: 90 tablet; Refill: 1  2. Type 2 diabetes mellitus without complication, without long-term current use of insulin ([place]) Patient needs A1c but he is having lab work done through his wife's work.  Has not done we will do it here on the next visit.  3. Pure hypercholesterolemia Follow-up lab work as discussed.  Patient has very bronze appearing skin.  Consider checking cortisol level in the future.  4. ED (erectile dysfunction) of organic origin   5. Compulsive tobacco user syndrome Advised patient to quit smoking.  He has no desire to do so.   Follow up in 3 months.     I,April Miller,acting as a scribe for Wilhemena Durie, MD.,have documented all relevant documentation on the behalf of Wilhemena Durie, MD,as directed by  Wilhemena Durie, MD while in the presence of Wilhemena Durie, MD.      Wilhemena Durie, MD  [facility]

## 2019-12-16 ENCOUNTER — Other Ambulatory Visit: Payer: Self-pay

## 2019-12-16 ENCOUNTER — Encounter: Payer: Self-pay | Admitting: Family Medicine

## 2019-12-16 ENCOUNTER — Ambulatory Visit (INDEPENDENT_AMBULATORY_CARE_PROVIDER_SITE_OTHER): Payer: BC Managed Care – PPO | Admitting: Family Medicine

## 2019-12-16 VITALS — BP 181/118 | HR 88 | Temp 97.5°F | Resp 16 | Ht 70.0 in | Wt 192.0 lb

## 2019-12-16 DIAGNOSIS — N529 Male erectile dysfunction, unspecified: Secondary | ICD-10-CM

## 2019-12-16 DIAGNOSIS — E7800 Pure hypercholesterolemia, unspecified: Secondary | ICD-10-CM

## 2019-12-16 DIAGNOSIS — E119 Type 2 diabetes mellitus without complications: Secondary | ICD-10-CM

## 2019-12-16 DIAGNOSIS — F17200 Nicotine dependence, unspecified, uncomplicated: Secondary | ICD-10-CM

## 2019-12-16 DIAGNOSIS — I10 Essential (primary) hypertension: Secondary | ICD-10-CM

## 2019-12-16 DIAGNOSIS — J439 Emphysema, unspecified: Secondary | ICD-10-CM

## 2019-12-16 MED ORDER — HYDROCHLOROTHIAZIDE 25 MG PO TABS: 25.0000 mg | ORAL_TABLET | ORAL | 1 refills | Status: DC

## 2019-12-16 MED ORDER — LOSARTAN POTASSIUM 100 MG PO TABS: 100.0000 mg | ORAL_TABLET | Freq: Every evening | ORAL | 1 refills | Status: DC

## 2019-12-16 MED ORDER — AMLODIPINE BESYLATE 10 MG PO TABS: ORAL_TABLET | ORAL | 1 refills | Status: DC

## 2020-03-17 NOTE — Progress Notes (Signed)
Trena Platt Mena Lienau,acting as a scribe for Wilhemena Durie, MD.,have documented all relevant documentation on the behalf of Wilhemena Durie, MD,as directed by  Wilhemena Durie, MD while in the presence of Wilhemena Durie, MD.  Established patient visit   Patient: Colin Jackson   DOB: [birth year]   64 y.o. Male  MRN: [id number] Visit Date: 03/20/2020  Today's healthcare provider: Wilhemena Durie, MD   Chief Complaint  Patient presents with  . Diabetes Mellitus  . Hypertension  . Hyperlipidemia   Subjective    HPI \ Patient feels well and has no desire to make any changes.  No desire to quit smoking.  He refused any lab work. A1c today is 7.2 which is much improved. Diabetes Mellitus Type II, follow-up  Lab Results  Component Value Date   HGBA1C 9.8 (A) 10/08/2018   HGBA1C 7.5 (A) 03/04/2018   HGBA1C 7.2 10/07/2017   Last seen for diabetes 3 months ago.  Management since then includes continuing the same treatment. He reports excellent compliance with treatment. He is not having side effects.   Home blood sugar records:   Episodes of hypoglycemia? No    Current insulin regiment: none Most Recent Eye Exam: overdue  --------------------------------------------------------------------------------------------------- Hypertension, follow-up  BP Readings from Last 3 Encounters:  03/20/20 (!) 143/90  12/16/19 (!) 181/118  10/08/18 (!) 142/100   Wt Readings from Last 3 Encounters:  03/20/20 190 lb 9.6 oz (86.5 kg)  12/16/19 192 lb (87.1 kg)  10/08/18 194 lb (88 kg)     He was last seen for hypertension 3 months ago.  BP at that visit was 181/118. Management since that visit includes; advised to continue taking medications. He reports excellent compliance with treatment. He is not having side effects.  He is exercising. He is adherent to low salt diet.   Outside blood pressures are not being checked.  He does smoke.  Use of agents associated  with hypertension: none.   --------------------------------------------------------------------------------------------------- Lipid/Cholesterol, follow-up  Last Lipid Panel: Lab Results  Component Value Date   CHOL 182 06/19/2017   LDLCALC 99 06/19/2017   HDL 59 06/19/2017   TRIG 120 06/19/2017    He was last seen for this 3 months ago.  Management since that visit includes; Follow-up lab work as discussed.  Patient has very bronze appearing skin.  Consider checking cortisol level in the future. He reports excellent compliance with treatment. He is not having side effects.  He is following a Regular diet. Current exercise: golfing  Last metabolic panel Lab Results  Component Value Date   NA 140 06/19/2017   K 4.7 06/19/2017   BUN 14 06/19/2017   CREATININE 0.8 06/19/2017   AST 22 06/19/2017   ALT 25 06/19/2017   The 10-year ASCVD risk score Mikey Bussing DC Jr., et al., 2013) is: 32.8%* (Cholesterol units were assumed)  ---------------------------------------------------------------------------------------------------   Social History   Tobacco Use  . Smoking status: Current Every Day Smoker    Packs/day: 2.00  . Smokeless tobacco: Never Used  Substance Use Topics  . Alcohol use: Yes    Comment: 2 malt beers a day  . Drug use: No       Medications: Outpatient Medications Prior to Visit  Medication Sig  . amLODipine (NORVASC) 10 MG tablet TAKE 1 TABLET BY MOUTH EVERY DAY IN THE EVENING  . hydrochlorothiazide (HYDRODIURIL) 25 MG tablet Take 1 tablet (25 mg total) by mouth every morning.  Marland Kitchen losartan (COZAAR)  100 MG tablet Take 1 tablet (100 mg total) by mouth every evening.  . metFORMIN (GLUCOPHAGE) 1000 MG tablet TAKE 1 TABLET BY MOUTH EVERY DAY WITH BREAKFAST   No facility-administered medications prior to visit.    Review of Systems  Constitutional: Negative for appetite change, chills and fever.  HENT: Negative.   Eyes: Negative.   Respiratory: Negative for  chest tightness, shortness of breath and wheezing.   Cardiovascular: Negative for chest pain and palpitations.  Gastrointestinal: Negative for abdominal pain, nausea and vomiting.  Endocrine: Negative.   Allergic/Immunologic: Negative.   Neurological: Negative.   Hematological: Negative.   Psychiatric/Behavioral: Negative.        Objective    BP (!) 143/90 (BP Location: Right Arm, Patient Position: Sitting, Cuff Size: Normal)   Pulse 93   Temp (!) 97.5 F (36.4 C) (Temporal)   Ht 5\' 10"  (1.778 m)   Wt 190 lb 9.6 oz (86.5 kg)   BMI 27.35 kg/m     Physical Exam Vitals reviewed.  Constitutional:      Appearance: He is well-developed.  HENT:     Head: Normocephalic and atraumatic.     Right Ear: External ear normal.     Left Ear: External ear normal.     Nose: Nose normal.  Eyes:     General: No scleral icterus. Neck:     Thyroid: No thyromegaly.  Cardiovascular:     Rate and Rhythm: Normal rate and regular rhythm.     Heart sounds: Normal heart sounds.  Pulmonary:     Effort: Pulmonary effort is normal.     Breath sounds: Normal breath sounds.  Abdominal:     Palpations: Abdomen is soft.  Skin:    General: Skin is warm and dry.  Neurological:     Mental Status: He is alert and oriented to person, place, and time.  Psychiatric:        Behavior: Behavior normal.        Thought Content: Thought content normal.        Judgment: Judgment normal.       No results found for any visits on 03/20/20.  Assessment & Plan     1. Type 2 diabetes mellitus without complication, without long-term current use of insulin (HCC) A1c 7.2.  Good control.  Continue Metformin - POCT HgB A1C  2. Essential (primary) hypertension Good control with losartan and amlodipine and HCTZ  3. Pure hypercholesterolemia Patient declines statin.  4. Compulsive tobacco user syndrome Patient declines stopping smoking.  Follow-up 4 months.   No follow-ups on file.      I, 03/22/20, MD, have reviewed all documentation for this visit. The documentation on 03/25/20 for the exam, diagnosis, procedures, and orders are all accurate and complete.    Richard 05/26/20, MD  [facility] [phone] (phone) [phone] (fax)  [facility]

## 2020-03-20 ENCOUNTER — Ambulatory Visit: Payer: BC Managed Care – PPO | Admitting: Family Medicine

## 2020-03-20 ENCOUNTER — Other Ambulatory Visit: Payer: Self-pay

## 2020-03-20 ENCOUNTER — Encounter: Payer: Self-pay | Admitting: Family Medicine

## 2020-03-20 VITALS — BP 143/90 | HR 93 | Temp 97.5°F | Ht 70.0 in | Wt 190.6 lb

## 2020-03-20 DIAGNOSIS — E119 Type 2 diabetes mellitus without complications: Secondary | ICD-10-CM

## 2020-03-20 DIAGNOSIS — F17200 Nicotine dependence, unspecified, uncomplicated: Secondary | ICD-10-CM | POA: Diagnosis not present

## 2020-03-20 DIAGNOSIS — E7800 Pure hypercholesterolemia, unspecified: Secondary | ICD-10-CM

## 2020-03-20 DIAGNOSIS — I10 Essential (primary) hypertension: Secondary | ICD-10-CM

## 2020-03-20 LAB — POCT GLYCOSYLATED HEMOGLOBIN (HGB A1C)
Estimated Average Glucose: 160
Hemoglobin A1C: 7.2 % — AB (ref 4.0–5.6)

## 2020-03-20 NOTE — Patient Instructions (Signed)
BRING IN COPY OF LAB WORK!!!

## 2020-07-20 ENCOUNTER — Ambulatory Visit: Payer: Self-pay | Admitting: Family Medicine

## 2020-08-31 ENCOUNTER — Ambulatory Visit: Payer: Self-pay | Admitting: Family Medicine

## 2020-09-26 ENCOUNTER — Ambulatory Visit: Payer: Self-pay | Admitting: Family Medicine

## 2020-10-25 ENCOUNTER — Other Ambulatory Visit: Payer: Self-pay | Admitting: Family Medicine

## 2020-10-25 DIAGNOSIS — I10 Essential (primary) hypertension: Secondary | ICD-10-CM

## 2020-10-26 ENCOUNTER — Other Ambulatory Visit: Payer: Self-pay | Admitting: Family Medicine

## 2020-10-26 DIAGNOSIS — I10 Essential (primary) hypertension: Secondary | ICD-10-CM

## 2020-10-30 ENCOUNTER — Ambulatory Visit: Payer: BC Managed Care – PPO | Admitting: Family Medicine

## 2020-10-30 ENCOUNTER — Other Ambulatory Visit: Payer: Self-pay

## 2020-10-30 ENCOUNTER — Encounter: Payer: Self-pay | Admitting: Family Medicine

## 2020-10-30 VITALS — BP 130/84 | HR 94 | Temp 98.4°F | Resp 16 | Ht 70.0 in | Wt 195.0 lb

## 2020-10-30 DIAGNOSIS — E119 Type 2 diabetes mellitus without complications: Secondary | ICD-10-CM | POA: Diagnosis not present

## 2020-10-30 DIAGNOSIS — F17200 Nicotine dependence, unspecified, uncomplicated: Secondary | ICD-10-CM

## 2020-10-30 DIAGNOSIS — I10 Essential (primary) hypertension: Secondary | ICD-10-CM | POA: Diagnosis not present

## 2020-10-30 LAB — POCT GLYCOSYLATED HEMOGLOBIN (HGB A1C): Hemoglobin A1C: 9.3 % — AB (ref 4.0–5.6)

## 2020-10-30 MED ORDER — METFORMIN HCL 500 MG PO TABS: 500.0000 mg | ORAL_TABLET | Freq: Two times a day (BID) | ORAL | 3 refills | Status: DC

## 2020-10-30 NOTE — Progress Notes (Signed)
Established patient visit   Patient: Colin Jackson   DOB: 17-Apr-1956   65 y.o. Male  MRN: [id number] Visit Date: 10/30/2020  Today's healthcare provider: Megan Mans, MD   Chief Complaint  Patient presents with  . Diabetes  . Hypertension  . Nicotine Dependence   Subjective    HPI  Patient is in for follow-up.  He feels well.  He has not been taking his Metformin nor is he following a diabetic diet.  He continues to smoke and has no plans to quit.  He does take his blood pressure medicine. He just welcomed this fourth grandchild, a 19-week-old granddaughter.   Diabetes Mellitus Type II, follow-up  Lab Results  Component Value Date   HGBA1C 9.3 (A) 10/30/2020   HGBA1C 7.2 (A) 03/20/2020   HGBA1C 9.8 (A) 10/08/2018   Last seen for diabetes 7 months ago.  Management since then includes continuing the same treatment. He reports poor compliance with treatment. He is having side effects. Patient reports that he does not take the Metformin daily. He reports that it makes his stomach upset.   Home blood sugar records: not being checke  Episodes of hypoglycemia? No    Current insulin regiment: none Most Recent Eye Exam: due  Hypertension, follow-up  BP Readings from Last 3 Encounters:  10/30/20 130/84  03/20/20 (!) 143/90  12/16/19 (!) 181/118   Wt Readings from Last 3 Encounters:  10/30/20 195 lb (88.5 kg)  03/20/20 190 lb 9.6 oz (86.5 kg)  12/16/19 192 lb (87.1 kg)     He was last seen for hypertension 7 months ago.  BP at that visit was 143/90. Management since that visit includes; Good control with losartan and amlodipine and HCTZ. He reports good compliance with treatment. He is not having side effects.  He is not exercising. He is adherent to low salt diet.   Outside blood pressures are not being checked.  He does smoke.  Use of agents associated with hypertension: none.   Compulsive tobacco user syndrome From 03/20/2020-Patient declines  stopping smoking.  Follow-up 4 months.       Medications: Outpatient Medications Prior to Visit  Medication Sig  . amLODipine (NORVASC) 10 MG tablet TAKE 1 TABLET BY MOUTH EVERY DAY IN THE EVENING  . hydrochlorothiazide (HYDRODIURIL) 25 MG tablet TAKE 1 TABLET BY MOUTH EVERY DAY IN THE MORNING  . losartan (COZAAR) 100 MG tablet TAKE 1 TABLET (100 MG TOTAL) BY MOUTH EVERY EVENING.  . metFORMIN (GLUCOPHAGE) 1000 MG tablet TAKE 1 TABLET BY MOUTH EVERY DAY WITH BREAKFAST   No facility-administered medications prior to visit.    Review of Systems  Constitutional: Negative for appetite change, chills and fever.  Respiratory: Negative for chest tightness, shortness of breath and wheezing.   Cardiovascular: Negative for chest pain and palpitations.  Gastrointestinal: Negative for abdominal pain, nausea and vomiting.        Objective    BP 130/84   Pulse 94   Temp 98.4 F (36.9 C)   Resp 16   Ht 5\' 10"  (1.778 m)   Wt 195 lb (88.5 kg)   BMI 27.98 kg/m  BP Readings from Last 3 Encounters:  10/30/20 130/84  03/20/20 (!) 143/90  12/16/19 (!) 181/118   Wt Readings from Last 3 Encounters:  10/30/20 195 lb (88.5 kg)  03/20/20 190 lb 9.6 oz (86.5 kg)  12/16/19 192 lb (87.1 kg)       Physical Exam Vitals reviewed.  Constitutional:      Appearance: He is well-developed.  HENT:     Head: Normocephalic and atraumatic.     Right Ear: External ear normal.     Left Ear: External ear normal.     Nose: Nose normal.  Eyes:     General: No scleral icterus. Neck:     Thyroid: No thyromegaly.  Cardiovascular:     Rate and Rhythm: Normal rate and regular rhythm.     Heart sounds: Normal heart sounds.  Pulmonary:     Effort: Pulmonary effort is normal.     Breath sounds: Normal breath sounds.  Abdominal:     Palpations: Abdomen is soft.  Skin:    General: Skin is warm and dry.  Neurological:     Mental Status: He is alert and oriented to person, place, and time.  Psychiatric:         Behavior: Behavior normal.        Thought Content: Thought content normal.        Judgment: Judgment normal.       Results for orders placed or performed in visit on 10/30/20  POCT glycosylated hemoglobin (Hb A1C)  Result Value Ref Range   Hemoglobin A1C 9.3 (A) 4.0 - 5.6 %   HbA1c POC (<> result, manual entry)     HbA1c, POC (prediabetic range)     HbA1c, POC (controlled diabetic range)      Assessment & Plan     1. Type 2 diabetes mellitus without complication, without long-term current use of insulin (HCC) We will try Metformin at 500 mg twice a day before meals and see how he does.  States he will stop it if he gets diarrhea again.  A1c back up to 9.3 point from 7.2. - POCT glycosylated hemoglobin (Hb A1C)  2. Essential (primary) hypertension Good control on amlodipine and HCTZ.  Would like to add losartan in the future. He is unwilling to get lab work or a physical.  He states when his wife's company draws labs through work he will get it done then.  3. Compulsive tobacco user syndrome Patient notes he needs to quit but has no desire to do so.   No follow-ups on file.      I, Megan Mans, MD, have reviewed all documentation for this visit. The documentation on 10/30/20 for the exam, diagnosis, procedures, and orders are all accurate and complete.    Zailen Albarran Wendelyn Breslow, MD  [facility] [phone] (phone) [phone] (fax)  [facility]

## 2020-11-21 ENCOUNTER — Other Ambulatory Visit: Payer: Self-pay | Admitting: Family Medicine

## 2020-11-21 DIAGNOSIS — I10 Essential (primary) hypertension: Secondary | ICD-10-CM

## 2020-11-21 NOTE — Telephone Encounter (Signed)
Future visit in 3 months  

## 2021-01-08 ENCOUNTER — Other Ambulatory Visit: Payer: Self-pay | Admitting: Family Medicine

## 2021-01-08 DIAGNOSIS — I10 Essential (primary) hypertension: Secondary | ICD-10-CM

## 2021-03-01 ENCOUNTER — Other Ambulatory Visit: Payer: Self-pay

## 2021-03-01 ENCOUNTER — Ambulatory Visit: Payer: BC Managed Care – PPO | Admitting: Family Medicine

## 2021-03-01 ENCOUNTER — Encounter: Payer: Self-pay | Admitting: Family Medicine

## 2021-03-01 VITALS — BP 143/74 | HR 87 | Temp 98.3°F | Resp 16 | Ht 70.0 in | Wt 186.0 lb

## 2021-03-01 DIAGNOSIS — I10 Essential (primary) hypertension: Secondary | ICD-10-CM | POA: Diagnosis not present

## 2021-03-01 DIAGNOSIS — E7800 Pure hypercholesterolemia, unspecified: Secondary | ICD-10-CM

## 2021-03-01 DIAGNOSIS — F17200 Nicotine dependence, unspecified, uncomplicated: Secondary | ICD-10-CM

## 2021-03-01 DIAGNOSIS — L989 Disorder of the skin and subcutaneous tissue, unspecified: Secondary | ICD-10-CM

## 2021-03-01 DIAGNOSIS — E119 Type 2 diabetes mellitus without complications: Secondary | ICD-10-CM | POA: Diagnosis not present

## 2021-03-01 LAB — POCT GLYCOSYLATED HEMOGLOBIN (HGB A1C): Hemoglobin A1C: 8.6 % — AB (ref 4.0–5.6)

## 2021-03-01 MED ORDER — LOSARTAN POTASSIUM 100 MG PO TABS: 1.0000 | ORAL_TABLET | Freq: Every evening | ORAL | 5 refills | Status: DC

## 2021-03-01 MED ORDER — METFORMIN HCL 500 MG PO TABS: 500.0000 mg | ORAL_TABLET | Freq: Every day | ORAL | 3 refills | Status: DC

## 2021-03-01 NOTE — Progress Notes (Signed)
Established patient visit   Patient: Colin Jackson   DOB: [birth year]   65 y.o. Male  MRN: [id number] Visit Date: 03/01/2021  Today's healthcare provider: Megan Mans, MD   Chief Complaint  Patient presents with   Diabetes   Hypertension   Hyperlipidemia   Subjective    HPI  Patient admits to not taking any of his medications.  He is working some on diet and exercise.  He continues to smoke.  He really has no complaints today. Diabetes Mellitus Type II, follow-up  Lab Results  Component Value Date   HGBA1C 8.6 (A) 03/01/2021   HGBA1C 9.3 (A) 10/30/2020   HGBA1C 7.2 (A) 03/20/2020   Last seen for diabetes 4 months ago.  Management since then includes; We will try Metformin at 500 mg twice a day before meals and see how he does.  He reports poor compliance with treatment. He is having side effects. Patient reports that Metformin gives him diarrhea. He has not taken Metformin in about 2 months.   Home blood sugar records:  not being checked  Episodes of hypoglycemia? No    Current insulin regiment: none Most Recent Eye Exam: due   Hypertension, follow-up  BP Readings from Last 3 Encounters:  03/01/21 (!) 143/74  10/30/20 130/84  03/20/20 (!) 143/90   Wt Readings from Last 3 Encounters:  03/01/21 186 lb (84.4 kg)  10/30/20 195 lb (88.5 kg)  03/20/20 190 lb 9.6 oz (86.5 kg)     He was last seen for hypertension 4 months ago.  BP at that visit was 130/84. Management since that visit includes; Good control on amlodipine and HCTZ.  Would like to add losartan in the future. He is unwilling to get lab work or a physical.  He states when his wife's company draws labs through work he will get it done then. He reports good compliance with treatment. He is not having side effects.  He is not exercising. He is adherent to low salt diet.   Outside blood pressures are not being checked.  He does smoke.  Use of agents associated with hypertension: none.    ---------------------------------------------------------------------------------------------------        Medications: Outpatient Medications Prior to Visit  Medication Sig   amLODipine (NORVASC) 10 MG tablet TAKE 1 TABLET BY MOUTH EVERY DAY IN THE EVENING   hydrochlorothiazide (HYDRODIURIL) 25 MG tablet TAKE 1 TABLET BY MOUTH EVERY DAY IN THE MORNING   losartan (COZAAR) 100 MG tablet TAKE 1 TABLET BY MOUTH EVERY EVENING.   metFORMIN (GLUCOPHAGE) 500 MG tablet Take 1 tablet (500 mg total) by mouth 2 (two) times daily with a meal.   No facility-administered medications prior to visit.    Review of Systems  Constitutional:  Negative for appetite change, chills and fever.  Respiratory:  Negative for chest tightness, shortness of breath and wheezing.   Cardiovascular:  Negative for chest pain and palpitations.  Gastrointestinal:  Negative for abdominal pain, nausea and vomiting.       Objective    BP (!) 143/74   Pulse 87   Temp 98.3 F (36.8 C)   Resp 16   Ht 5\' 10"  (1.778 m)   Wt 186 lb (84.4 kg)   BMI 26.69 kg/m  BP Readings from Last 3 Encounters:  03/01/21 (!) 143/74  10/30/20 130/84  03/20/20 (!) 143/90   Wt Readings from Last 3 Encounters:  03/01/21 186 lb (84.4 kg)  10/30/20 195 lb (88.5 kg)  03/20/20 190 lb 9.6 oz (86.5 kg)       Physical Exam Vitals reviewed.  Constitutional:      Appearance: He is well-developed.  HENT:     Head: Normocephalic and atraumatic.     Right Ear: External ear normal.     Left Ear: External ear normal.     Nose: Nose normal.  Eyes:     General: No scleral icterus. Neck:     Thyroid: No thyromegaly.  Cardiovascular:     Rate and Rhythm: Normal rate and regular rhythm.     Heart sounds: Normal heart sounds.  Pulmonary:     Effort: Pulmonary effort is normal.     Breath sounds: Normal breath sounds.  Abdominal:     Palpations: Abdomen is soft.  Skin:    General: Skin is warm and dry.  Neurological:     Mental  Status: He is alert and oriented to person, place, and time.  Psychiatric:        Behavior: Behavior normal.        Thought Content: Thought content normal.        Judgment: Judgment normal.      Results for orders placed or performed in visit on 03/01/21  POCT glycosylated hemoglobin (Hb A1C)  Result Value Ref Range   Hemoglobin A1C 8.6 (A) 4.0 - 5.6 %   HbA1c POC (<> result, manual entry)     HbA1c, POC (prediabetic range)     HbA1c, POC (controlled diabetic range)      Assessment & Plan     1. Type 2 diabetes mellitus without complication, without long-term current use of insulin (HCC) A1c is 8.6 which is slightly better than last visit.  I have talked the patient into starting metformin.  Follow-up in 3 to 4 months to repeat A1c.  Patient will almost certainly need a statin - POCT glycosylated hemoglobin (Hb A1C) - metFORMIN (GLUCOPHAGE) 500 MG tablet; Take 1 tablet (500 mg total) by mouth daily with breakfast.  Dispense: 90 tablet; Refill: 3  2. Essential (primary) hypertension Take losartan 100 mg daily - losartan (COZAAR) 100 MG tablet; Take 1 tablet (100 mg total) by mouth every evening.  Dispense: 30 tablet; Refill: 5  3. Skin lesion I think the patient has a squamous cell or basal cell carcinoma, at least 1, on his forehead.  Refer to dermatology.  He states he is willing to go. - Ambulatory referral to Dermatology  4. Compulsive tobacco user syndrome Patient strongly advised quit smoking  5. Pure hypercholesterolemia Will probably need a statin on his next visit. He states he has lab work done through his wife's office.   No follow-ups on file.      I, Megan Mans, MD, have reviewed all documentation for this visit. The documentation on 03/05/21 for the exam, diagnosis, procedures, and orders are all accurate and complete.    Richard Wendelyn Breslow, MD  [facility] [phone] (phone) [phone] (fax)  [facility]

## 2021-04-05 ENCOUNTER — Ambulatory Visit: Payer: Self-pay | Admitting: *Deleted

## 2021-04-05 ENCOUNTER — Telehealth: Payer: Self-pay

## 2021-04-05 MED ORDER — MOLNUPIRAVIR EUA 200MG CAPSULE: 4.0000 | ORAL_CAPSULE | Freq: Two times a day (BID) | ORAL | 0 refills | Status: AC

## 2021-04-05 NOTE — Telephone Encounter (Signed)
Summary: Covid +   Pts wife called in to let Colin Jackson know that he tested positive for COVID on 7/13, pts wife wanted to know what she should do next or if the pt should be on any medication.      Reason for Disposition  [1] HIGH RISK for severe COVID complications (e.g., weak immune system, age > 64 years, obesity with BMI > 25, pregnant, chronic lung disease or other chronic medical condition) AND [2] COVID symptoms (e.g., cough, fever)  (Exceptions: Already seen by PCP and no new or worsening symptoms.)  Answer Assessment - Initial Assessment Questions 1. COVID-19 DIAGNOSIS: "Who made your COVID-19 diagnosis?" "Was it confirmed by a positive lab test or self-test?" If not diagnosed by a doctor (or NP/PA), ask "Are there lots of cases (community spread) where you live?" Note: See public health department website, if unsure.     + home test- 7/13 2. COVID-19 EXPOSURE: "Was there any known exposure to COVID before the symptoms began?" CDC Definition of close contact: within 6 feet (2 meters) for a total of 15 minutes or more over a 24-hour period.      Yes- patient's wife  3. ONSET: "When did the COVID-19 symptoms start?"      Tuesday night 4. WORST SYMPTOM: "What is your worst symptom?" (e.g., cough, fever, shortness of breath, muscle aches)     fatigue 5. COUGH: "Do you have a cough?" If Yes, ask: "How bad is the cough?"       Yes- chest congestion 6. FEVER: "Do you have a fever?" If Yes, ask: "What is your temperature, how was it measured, and when did it start?"     Yes- low grade 7. RESPIRATORY STATUS: "Describe your breathing?" (e.g., shortness of breath, wheezing, unable to speak)      No SOB 8. BETTER-SAME-WORSE: "Are you getting better, staying the same or getting worse compared to yesterday?"  If getting worse, ask, "In what way?"     Worse- increased symptoms 9. HIGH RISK DISEASE: "Do you have any chronic medical problems?" (e.g., asthma, heart or lung disease, weak immune  system, obesity, etc.)     Smoker, age, high BP 10. VACCINE: "Have you had the COVID-19 vaccine?" If Yes, ask: "Which one, how many shots, when did you get it?"       Yes- pfizer  11. BOOSTER: "Have you received your COVID-19 booster?" If Yes, ask: "Which one and when did you get it?"       no 12. PREGNANCY: "Is there any chance you are pregnant?" "When was your last menstrual period?"       N/a 13. OTHER SYMPTOMS: "Do you have any other symptoms?"  (e.g., chills, fatigue, headache, loss of smell or taste, muscle pain, sore throat)       Fatigue, chills 14. O2 SATURATION MONITOR:  "Do you use an oxygen saturation monitor (pulse oximeter) at home?" If Yes, ask "What is your reading (oxygen level) today?" "What is your usual oxygen saturation reading?" (e.g., 95%)       no  Protocols used: Coronavirus (COVID-19) Diagnosed or Suspected-A-AH

## 2021-04-05 NOTE — Telephone Encounter (Signed)
Copied from CRM [id number]. Topic: General - Other >> Apr 05, 2021  2:01 PM Wyonia Hough E wrote: Reason for CRM: Pts wife called to check of status of medication for covid/ please advise

## 2021-04-05 NOTE — Addendum Note (Signed)
Addended by: Janey Greaser D on: 04/05/2021 03:58 PM   Modules accepted: Orders

## 2021-04-05 NOTE — Telephone Encounter (Signed)
Call to patient/wife- patient had home exposure and developed symptoms Tuesday evening. Patient had + COVID home test yesterday. Advised will send message to provider for review a possible treatment- advised per COVID protocol- treatment, isolation and weekend virtual visit if symptoms increase if needed.

## 2021-04-09 ENCOUNTER — Other Ambulatory Visit: Payer: Self-pay | Admitting: Family Medicine

## 2021-04-09 DIAGNOSIS — I10 Essential (primary) hypertension: Secondary | ICD-10-CM

## 2021-06-08 DIAGNOSIS — M545 Low back pain, unspecified: Secondary | ICD-10-CM | POA: Diagnosis not present

## 2021-06-08 DIAGNOSIS — M25552 Pain in left hip: Secondary | ICD-10-CM | POA: Diagnosis not present

## 2021-07-09 DIAGNOSIS — M25552 Pain in left hip: Secondary | ICD-10-CM | POA: Diagnosis not present

## 2021-07-16 ENCOUNTER — Other Ambulatory Visit: Payer: Self-pay | Admitting: Family Medicine

## 2021-07-16 DIAGNOSIS — I10 Essential (primary) hypertension: Secondary | ICD-10-CM

## 2021-08-06 DIAGNOSIS — M545 Low back pain, unspecified: Secondary | ICD-10-CM | POA: Diagnosis not present

## 2021-08-06 DIAGNOSIS — M25552 Pain in left hip: Secondary | ICD-10-CM | POA: Diagnosis not present

## 2021-08-10 DIAGNOSIS — M25552 Pain in left hip: Secondary | ICD-10-CM | POA: Diagnosis not present

## 2021-08-28 ENCOUNTER — Ambulatory Visit: Payer: Self-pay | Admitting: Family Medicine

## 2021-09-04 ENCOUNTER — Ambulatory Visit: Payer: BC Managed Care – PPO | Admitting: Family Medicine

## 2021-09-04 ENCOUNTER — Encounter: Payer: Self-pay | Admitting: Family Medicine

## 2021-09-04 ENCOUNTER — Other Ambulatory Visit: Payer: Self-pay

## 2021-09-04 VITALS — BP 140/84 | HR 88 | Temp 98.1°F | Resp 16 | Ht 70.0 in | Wt 182.0 lb

## 2021-09-04 DIAGNOSIS — E119 Type 2 diabetes mellitus without complications: Secondary | ICD-10-CM

## 2021-09-04 DIAGNOSIS — Z114 Encounter for screening for human immunodeficiency virus [HIV]: Secondary | ICD-10-CM | POA: Diagnosis not present

## 2021-09-04 DIAGNOSIS — F17200 Nicotine dependence, unspecified, uncomplicated: Secondary | ICD-10-CM

## 2021-09-04 DIAGNOSIS — Z1159 Encounter for screening for other viral diseases: Secondary | ICD-10-CM | POA: Diagnosis not present

## 2021-09-04 DIAGNOSIS — E7800 Pure hypercholesterolemia, unspecified: Secondary | ICD-10-CM

## 2021-09-04 DIAGNOSIS — I10 Essential (primary) hypertension: Secondary | ICD-10-CM

## 2021-09-04 DIAGNOSIS — J439 Emphysema, unspecified: Secondary | ICD-10-CM

## 2021-09-04 LAB — POCT GLYCOSYLATED HEMOGLOBIN (HGB A1C)
Est. average glucose Bld gHb Est-mCnc: 212
Hemoglobin A1C: 9 % — AB (ref 4.0–5.6)

## 2021-09-04 MED ORDER — HYDROCHLOROTHIAZIDE 25 MG PO TABS: ORAL_TABLET | ORAL | 1 refills | Status: DC

## 2021-09-04 MED ORDER — LOSARTAN POTASSIUM 100 MG PO TABS: 100.0000 mg | ORAL_TABLET | Freq: Every evening | ORAL | 3 refills | Status: DC

## 2021-09-04 MED ORDER — METFORMIN HCL ER 750 MG PO TB24: 750.0000 mg | ORAL_TABLET | Freq: Every day | ORAL | 3 refills | Status: DC

## 2021-09-04 NOTE — Assessment & Plan Note (Signed)
Uncontrolled, a1c 9.0 today. Extensive discussion had today regarding diabetic control. Willing to try extended release form of metformin, if doesn't tolerate plan to try SGLT2 inhibitor. F/u in 3 months. Recommend obtaining labs before next appt.

## 2021-09-04 NOTE — Progress Notes (Signed)
° °  SUBJECTIVE:   CHIEF COMPLAINT / HPI:   Hypertension: - Medications: losartan, amlodipine, HCTZ - Compliance: has been out of one of his meds, unsure of which one.  - Checking BP at home: no - Denies any LE edema, medication SEs, or symptoms of hypotension  Diabetes, Type 2 - Last A1c 8.6 02/2021 - Medications: metformin - Compliance: not taking. Had diarrhea.  - Checking BG at home: no - Exercise: none - Eye exam: due - Foot exam: due - Microalbumin: due - Statin: no - PNA vaccine: due - Denies symptoms of hypoglycemia, polyuria, polydipsia, numbness extremities, foot ulcers/trauma  Emphysema/COPD - Medications: none - Compliance: n/a - Exacerbations in last 6 months: none - current smoker, 2ppd since teen years.  - never been screened for lung cancer, not interested   OBJECTIVE:   BP 140/84 (BP Location: Left Arm, Patient Position: Sitting, Cuff Size: Large)    Pulse 88    Temp 98.1 F (36.7 C) (Temporal)    Resp 16    Ht 5\' 10"  (1.778 m)    Wt 182 lb (82.6 kg)    SpO2 99%    BMI 26.11 kg/m   Gen: well appearing, in NAD Card: RRR Lungs: CTAB, no wheeze/rales Ext: WWP, no edema. Foot exam wnl.    ASSESSMENT/PLAN:   Essential (primary) hypertension Elevated today but out of one of his meds. Refills provided. Recommended lab work, patient declines today. Will sign orders for him to come by for lab visit. F/u 3 months.  Pulmonary emphysema (HCC) Doing well, continue to monitor.  Type 2 diabetes mellitus (HCC) Uncontrolled, a1c 9.0 today. Extensive discussion had today regarding diabetic control. Willing to try extended release form of metformin, if doesn't tolerate plan to try SGLT2 inhibitor. F/u in 3 months. Recommend obtaining labs before next appt.   Compulsive tobacco user syndrome Recommended cessation. Declines lung cancer screening and PCV.   Pure hypercholesterolemia Declines labs today. Previously resistant to statin, continue to assess readiness.       , DO

## 2021-09-04 NOTE — Assessment & Plan Note (Signed)
Recommended cessation. Declines lung cancer screening and PCV.

## 2021-09-04 NOTE — Assessment & Plan Note (Signed)
Elevated today but out of one of his meds. Refills provided. Recommended lab work, patient declines today. Will sign orders for him to come by for lab visit. F/u 3 months.

## 2021-09-04 NOTE — Patient Instructions (Signed)
Take the metformin, let us know if you don't tolerate. Come back for labs. Let us know if you want help to quit smoking. Come back in 3 months.

## 2021-09-04 NOTE — Assessment & Plan Note (Signed)
Declines labs today. Previously resistant to statin, continue to assess readiness.

## 2021-09-04 NOTE — Assessment & Plan Note (Signed)
Doing well, continue to monitor 

## 2021-09-07 DIAGNOSIS — M1612 Unilateral primary osteoarthritis, left hip: Secondary | ICD-10-CM | POA: Diagnosis not present

## 2021-10-29 DIAGNOSIS — D225 Melanocytic nevi of trunk: Secondary | ICD-10-CM | POA: Diagnosis not present

## 2021-10-29 DIAGNOSIS — D2262 Melanocytic nevi of left upper limb, including shoulder: Secondary | ICD-10-CM | POA: Diagnosis not present

## 2021-10-29 DIAGNOSIS — D485 Neoplasm of uncertain behavior of skin: Secondary | ICD-10-CM | POA: Diagnosis not present

## 2021-10-29 DIAGNOSIS — L570 Actinic keratosis: Secondary | ICD-10-CM | POA: Diagnosis not present

## 2021-10-29 DIAGNOSIS — D2261 Melanocytic nevi of right upper limb, including shoulder: Secondary | ICD-10-CM | POA: Diagnosis not present

## 2021-10-29 DIAGNOSIS — D2272 Melanocytic nevi of left lower limb, including hip: Secondary | ICD-10-CM | POA: Diagnosis not present

## 2021-10-29 DIAGNOSIS — X32XXXA Exposure to sunlight, initial encounter: Secondary | ICD-10-CM | POA: Diagnosis not present

## 2021-10-29 DIAGNOSIS — D0461 Carcinoma in situ of skin of right upper limb, including shoulder: Secondary | ICD-10-CM | POA: Diagnosis not present

## 2022-02-11 ENCOUNTER — Other Ambulatory Visit: Payer: Self-pay | Admitting: Family Medicine

## 2022-02-11 DIAGNOSIS — I10 Essential (primary) hypertension: Secondary | ICD-10-CM

## 2022-02-11 NOTE — Telephone Encounter (Signed)
Medication Refill - Medication: hydrochlorothiazide (HYDRODIURIL) 25 MG tablet  losartan (COZAAR) 100 MG tablet amLODipine (NORVASC) 10 MG tablet   Pt is completely out of his current supply   Has the patient contacted their pharmacy? Yes.   (Agent: If no, request that the patient contact the pharmacy for the refill. If patient does not wish to contact the pharmacy document the reason why and proceed with request.) (Agent: If yes, when and what did the pharmacy advise?)  Preferred Pharmacy (with phone number or street name):  [id number] - [place], [address]  [address] [place] [address]  Phone: [phone] Fax: [phone]   Has the patient been seen for an appointment in the last year OR does the patient have an upcoming appointment? Yes.    Agent: Please be advised that RX refills may take up to 3 business days. We ask that you follow-up with your pharmacy.

## 2022-02-12 MED ORDER — HYDROCHLOROTHIAZIDE 25 MG PO TABS: ORAL_TABLET | ORAL | 0 refills | Status: DC

## 2022-02-12 NOTE — Telephone Encounter (Signed)
Requested medications are due for refill today.  yes  Requested medications are on the active medications list.  yes  Last refill. 09/04/2021 #90 1 refill  Future visit scheduled.   no  Notes to clinic.  Medication refill failed protocol due to expired labs    Requested Prescriptions  Pending Prescriptions Disp Refills   hydrochlorothiazide (HYDRODIURIL) 25 MG tablet 90 tablet 1    Sig: TAKE 1 TABLET BY MOUTH EVERY DAY IN THE MORNING     Cardiovascular: Diuretics - Thiazide Failed - 02/11/2022 11:00 AM      Failed - Cr in normal range and within 180 days    Creatinine  Date Value Ref Range Status  06/19/2017 0.8 0.6 - 1.3 Final         Failed - K in normal range and within 180 days    Potassium  Date Value Ref Range Status  06/19/2017 4.7 3.4 - 5.3 Final         Failed - Na in normal range and within 180 days    Sodium  Date Value Ref Range Status  06/19/2017 140 137 - 147 Final         Failed - Last BP in normal range    BP Readings from Last 1 Encounters:  09/04/21 140/84         Passed - Valid encounter within last 6 months    Recent Outpatient Visits           5 months ago Type 2 diabetes mellitus without complication, without long-term current use of insulin [facility])   [facility] Myles Gip, DO   11 months ago Type 2 diabetes mellitus without complication, without long-term current use of insulin ([place])   [facility] Jerrol Banana., MD   1 year ago Type 2 diabetes mellitus without complication, without long-term current use of insulin [facility])   [facility] Jerrol Banana., MD   1 year ago Type 2 diabetes mellitus without complication, without long-term current use of insulin [facility])   [facility] Jerrol Banana., MD   2 years ago Type 2 diabetes mellitus without complication, without long-term current use of insulin [facility])   [facility] Jerrol Banana., MD                Refused Prescriptions Disp Refills   losartan (COZAAR) 100 MG tablet 90 tablet 3    Sig: Take 1 tablet (100 mg total) by mouth every evening.     Cardiovascular:  Angiotensin Receptor Blockers Failed - 02/11/2022 11:00 AM      Failed - Cr in normal range and within 180 days    Creatinine  Date Value Ref Range Status  06/19/2017 0.8 0.6 - 1.3 Final         Failed - K in normal range and within 180 days    Potassium  Date Value Ref Range Status  06/19/2017 4.7 3.4 - 5.3 Final         Failed - Last BP in normal range    BP Readings from Last 1 Encounters:  09/04/21 140/84         Passed - Patient is not pregnant      Passed - Valid encounter within last 6 months    Recent Outpatient Visits           5 months ago Type 2 diabetes mellitus without complication, without long-term current use of  insulin [facility])   [facility] Rory Percy M, DO   11 months ago Type 2 diabetes mellitus without complication, without long-term current use of insulin [facility])   [facility] Jerrol Banana., MD   1 year ago Type 2 diabetes mellitus without complication, without long-term current use of insulin [facility])   [facility] Jerrol Banana., MD   1 year ago Type 2 diabetes mellitus without complication, without long-term current use of insulin [facility])   [facility] Jerrol Banana., MD   2 years ago Type 2 diabetes mellitus without complication, without long-term current use of insulin ([place])   [facility] Jerrol Banana., MD                amLODipine (NORVASC) 10 MG tablet 90 tablet 3     Cardiovascular: Calcium Channel Blockers 2 Failed - 02/11/2022 11:00 AM      Failed - Last BP in normal range    BP Readings from Last 1 Encounters:  09/04/21 140/84         Passed - Last Heart Rate in normal range    Pulse Readings from Last 1 Encounters:  09/04/21 88          Passed - Valid encounter within last 6 months    Recent Outpatient Visits           5 months ago Type 2 diabetes mellitus without complication, without long-term current use of insulin [facility])   [facility] Rory Percy M, DO   11 months ago Type 2 diabetes mellitus without complication, without long-term current use of insulin [facility])   [facility] Jerrol Banana., MD   1 year ago Type 2 diabetes mellitus without complication, without long-term current use of insulin [facility])   [facility] Jerrol Banana., MD   1 year ago Type 2 diabetes mellitus without complication, without long-term current use of insulin [facility])   [facility] Jerrol Banana., MD   2 years ago Type 2 diabetes mellitus without complication, without long-term current use of insulin [facility])   [facility] Jerrol Banana., MD

## 2022-02-12 NOTE — Telephone Encounter (Signed)
Losartan refilled 09/04/2021 #90 3 refills - 1 year supply Amlodipine refilled 07/16/2021 #90 3 refills - 1 year supply Requested Prescriptions  Pending Prescriptions Disp Refills  . hydrochlorothiazide (HYDRODIURIL) 25 MG tablet 90 tablet 1    Sig: TAKE 1 TABLET BY MOUTH EVERY DAY IN THE MORNING     Cardiovascular: Diuretics - Thiazide Failed - 02/11/2022 11:00 AM      Failed - Cr in normal range and within 180 days    Creatinine  Date Value Ref Range Status  06/19/2017 0.8 0.6 - 1.3 Final         Failed - K in normal range and within 180 days    Potassium  Date Value Ref Range Status  06/19/2017 4.7 3.4 - 5.3 Final         Failed - Na in normal range and within 180 days    Sodium  Date Value Ref Range Status  06/19/2017 140 137 - 147 Final         Failed - Last BP in normal range    BP Readings from Last 1 Encounters:  09/04/21 140/84         Passed - Valid encounter within last 6 months    Recent Outpatient Visits          5 months ago Type 2 diabetes mellitus without complication, without long-term current use of insulin [facility])   [facility] Caro Laroche, DO   11 months ago Type 2 diabetes mellitus without complication, without long-term current use of insulin (HCC)   [facility] Maple Hudson., MD   1 year ago Type 2 diabetes mellitus without complication, without long-term current use of insulin [facility])   [facility] Maple Hudson., MD   1 year ago Type 2 diabetes mellitus without complication, without long-term current use of insulin [facility])   [facility] Maple Hudson., MD   2 years ago Type 2 diabetes mellitus without complication, without long-term current use of insulin [facility])   [facility] Maple Hudson., MD             . losartan (COZAAR) 100 MG tablet 90 tablet 3    Sig: Take 1 tablet (100 mg total) by mouth every evening.     Cardiovascular:   Angiotensin Receptor Blockers Failed - 02/11/2022 11:00 AM      Failed - Cr in normal range and within 180 days    Creatinine  Date Value Ref Range Status  06/19/2017 0.8 0.6 - 1.3 Final         Failed - K in normal range and within 180 days    Potassium  Date Value Ref Range Status  06/19/2017 4.7 3.4 - 5.3 Final         Failed - Last BP in normal range    BP Readings from Last 1 Encounters:  09/04/21 140/84         Passed - Patient is not pregnant      Passed - Valid encounter within last 6 months    Recent Outpatient Visits          5 months ago Type 2 diabetes mellitus without complication, without long-term current use of insulin [facility])   [facility] Caro Laroche, DO   11 months ago Type 2 diabetes mellitus without complication, without long-term current use of insulin [facility])   [facility] Maple Hudson., MD  1 year ago Type 2 diabetes mellitus without complication, without long-term current use of insulin [facility])   [facility] Maple Hudson., MD   1 year ago Type 2 diabetes mellitus without complication, without long-term current use of insulin [facility])   [facility] Maple Hudson., MD   2 years ago Type 2 diabetes mellitus without complication, without long-term current use of insulin (HCC)   [facility] Maple Hudson., MD             . amLODipine (NORVASC) 10 MG tablet 90 tablet 3     Cardiovascular: Calcium Channel Blockers 2 Failed - 02/11/2022 11:00 AM      Failed - Last BP in normal range    BP Readings from Last 1 Encounters:  09/04/21 140/84         Passed - Last Heart Rate in normal range    Pulse Readings from Last 1 Encounters:  09/04/21 88         Passed - Valid encounter within last 6 months    Recent Outpatient Visits          5 months ago Type 2 diabetes mellitus without complication, without long-term current use of insulin [facility])    [facility] [place] Dense M, DO   11 months ago Type 2 diabetes mellitus without complication, without long-term current use of insulin [facility])   [facility] Maple Hudson., MD   1 year ago Type 2 diabetes mellitus without complication, without long-term current use of insulin [facility])   [facility] Maple Hudson., MD   1 year ago Type 2 diabetes mellitus without complication, without long-term current use of insulin [facility])   [facility] Maple Hudson., MD   2 years ago Type 2 diabetes mellitus without complication, without long-term current use of insulin [facility])   [facility] Maple Hudson., MD

## 2022-05-13 DIAGNOSIS — M545 Low back pain, unspecified: Secondary | ICD-10-CM | POA: Diagnosis not present

## 2022-05-13 DIAGNOSIS — M1612 Unilateral primary osteoarthritis, left hip: Secondary | ICD-10-CM | POA: Diagnosis not present

## 2022-06-10 DIAGNOSIS — M1612 Unilateral primary osteoarthritis, left hip: Secondary | ICD-10-CM | POA: Diagnosis not present

## 2022-06-13 ENCOUNTER — Ambulatory Visit: Payer: BC Managed Care – PPO | Admitting: Family Medicine

## 2022-06-13 ENCOUNTER — Encounter: Payer: Self-pay | Admitting: Family Medicine

## 2022-06-13 VITALS — BP 148/94 | HR 93 | Temp 97.9°F | Wt 172.0 lb

## 2022-06-13 DIAGNOSIS — J439 Emphysema, unspecified: Secondary | ICD-10-CM

## 2022-06-13 DIAGNOSIS — M25559 Pain in unspecified hip: Secondary | ICD-10-CM

## 2022-06-13 DIAGNOSIS — E119 Type 2 diabetes mellitus without complications: Secondary | ICD-10-CM | POA: Diagnosis not present

## 2022-06-13 DIAGNOSIS — I10 Essential (primary) hypertension: Secondary | ICD-10-CM | POA: Diagnosis not present

## 2022-06-13 DIAGNOSIS — F17200 Nicotine dependence, unspecified, uncomplicated: Secondary | ICD-10-CM

## 2022-06-13 DIAGNOSIS — E7800 Pure hypercholesterolemia, unspecified: Secondary | ICD-10-CM

## 2022-06-13 NOTE — Progress Notes (Signed)
Established patient visit   Patient: Colin Jackson   DOB: [birth year]   66 y.o. Male  MRN: [id number] Visit Date: 06/13/2022  Today's healthcare provider: Wilhemena Durie, MD   No chief complaint on file.  Subjective    HPI  Patient is here for medical clearance for hip replacement. He is not yet scheduled.  Dr. Mardelle Matte is the orthopedic surgeon.  Patient denies any personal history of bleeding or clotting disorder.  He denies any adverse event to anesthesia.  Patient also denies any known family history of bleeding or clotting disorder and no known family history of adverse reaction to anesthesia.   Patient does not currently take any blood thinners, steroids or NSAIDs Patient knows he needs to quit smoking. Medications: Outpatient Medications Prior to Visit  Medication Sig   amLODipine (NORVASC) 10 MG tablet TAKE 1 TABLET BY MOUTH EVERY DAY IN THE EVENING   hydrochlorothiazide (HYDRODIURIL) 25 MG tablet TAKE 1 TABLET BY MOUTH EVERY DAY IN THE MORNING. Please schedule office visit before any future refill.   losartan (COZAAR) 100 MG tablet Take 1 tablet (100 mg total) by mouth every evening.   metFORMIN (GLUCOPHAGE XR) 750 MG 24 hr tablet Take 1 tablet (750 mg total) by mouth daily with breakfast.   No facility-administered medications prior to visit.    Review of Systems  Constitutional:  Negative for appetite change, chills and fever.  Respiratory:  Negative for chest tightness, shortness of breath and wheezing.   Cardiovascular:  Negative for chest pain and palpitations.  Gastrointestinal:  Negative for abdominal pain, nausea and vomiting.    Last hemoglobin A1c Lab Results  Component Value Date   HGBA1C 9.0 (A) 09/04/2021       Objective    BP (!) 148/94 (BP Location: Right Arm, Patient Position: Sitting, Cuff Size: Normal)   Pulse 93   Temp 97.9 F (36.6 C) (Oral)   Wt 172 lb (78 kg)   SpO2 (!) 16%   BMI 24.68 kg/m  BP Readings from Last 3  Encounters:  06/13/22 (!) 148/94  09/04/21 140/84  03/01/21 (!) 143/74   Wt Readings from Last 3 Encounters:  06/13/22 172 lb (78 kg)  09/04/21 182 lb (82.6 kg)  03/01/21 186 lb (84.4 kg)      Physical Exam Vitals reviewed.  Constitutional:      Appearance: He is well-developed.  HENT:     Head: Normocephalic and atraumatic.     Right Ear: External ear normal.     Left Ear: External ear normal.     Nose: Nose normal.  Eyes:     General: No scleral icterus. Neck:     Thyroid: No thyromegaly.  Cardiovascular:     Rate and Rhythm: Normal rate and regular rhythm.     Heart sounds: Normal heart sounds.  Pulmonary:     Effort: Pulmonary effort is normal.     Breath sounds: Normal breath sounds.  Abdominal:     Palpations: Abdomen is soft.  Skin:    General: Skin is warm and dry.  Neurological:     Mental Status: He is alert and oriented to person, place, and time.  Psychiatric:        Behavior: Behavior normal.        Thought Content: Thought content normal.        Judgment: Judgment normal.     ECG reveals sinus tachycardia with a rate of about 100 with no ischemic changes.  No results found for any visits on 06/13/22.  Assessment & Plan     1. Type 2 diabetes mellitus without complication, without long-term current use of insulin (HCC) 1C is 9.0.  I would like to see a list less than 7-7.5. Admittedly noncompliant.  Consider continuous glucose monitor but need to get on Metformin daily. He is a very nice gentleman who is been admittedly completely noncompliant with follow-up and medications. 2. Essential (primary) hypertension Patient again needs to take his medications which are amlodipine losartan and HCTZ  3. Pure hypercholesterolemia Plan Labs and patient states he is now willing to take statin if necessary.  4. Compulsive tobacco user syndrome Patient working on trying to quit for his surgery  5. Hip pain, unspecified laterality Patient cleared for  total hip replacement. I think getting back on his medications that is prescribed will help with sugar and blood pressure control   No follow-ups on file.      I, Megan Mans, MD, have reviewed all documentation for this visit. The documentation on 06/19/22 for the exam, diagnosis, procedures, and orders are all accurate and complete.    [name], MD  [facility] [phone] (phone) [phone] (fax)  [facility]

## 2022-06-26 ENCOUNTER — Telehealth: Payer: Self-pay | Admitting: Family Medicine

## 2022-06-26 NOTE — Telephone Encounter (Signed)
Followed up with patient ..2nd fax (medical clearance) was sent today at 9:45am.,06/26/2022 to Dr.Landau.. Patient will follow up with [place] to make sure it was received. Thank you

## 2022-07-02 ENCOUNTER — Encounter: Payer: Self-pay | Admitting: Physician Assistant

## 2022-07-02 ENCOUNTER — Ambulatory Visit: Payer: BC Managed Care – PPO | Admitting: Physician Assistant

## 2022-07-02 VITALS — BP 150/93 | HR 101 | Temp 98.0°F | Resp 16 | Wt 172.0 lb

## 2022-07-02 DIAGNOSIS — E1159 Type 2 diabetes mellitus with other circulatory complications: Secondary | ICD-10-CM | POA: Diagnosis not present

## 2022-07-02 DIAGNOSIS — E1165 Type 2 diabetes mellitus with hyperglycemia: Secondary | ICD-10-CM | POA: Diagnosis not present

## 2022-07-02 DIAGNOSIS — I152 Hypertension secondary to endocrine disorders: Secondary | ICD-10-CM | POA: Diagnosis not present

## 2022-07-02 LAB — POCT GLYCOSYLATED HEMOGLOBIN (HGB A1C)
Est. average glucose Bld gHb Est-mCnc: 169
Hemoglobin A1C: 7.5 % — AB (ref 4.0–5.6)

## 2022-07-02 NOTE — Assessment & Plan Note (Addendum)
Pt reports he is taking metformin consistently. poc a1c today 7.5% Advised pt that we need updated lab work, at minimum a CMP to decide what additional medications are appropriate, and to evaluate kidney function as a result of uncontrolled diabetes Pt declines labwork today, declines any additional work up.  Pt declines to make follow up appointment.

## 2022-07-02 NOTE — Assessment & Plan Note (Addendum)
Elevated in office. Pt refuses to discuss management today. Pt states he only takes medications M-F takes no medications on the weekends. Advised against this. Refuses labs

## 2022-07-02 NOTE — Progress Notes (Signed)
I,Colin Jackson,acting as a scribe for [employer], PA-C.,have documented all relevant documentation on the behalf of Colin Kirschner, PA-C,as directed by  Colin Kirschner, PA-C while in the presence of Colin Kirschner, PA-C.   Established patient visit  Patient: Colin [facility]   DOB: [birth year]   66 y.o. Male  MRN: [id number] Visit Date: 07/02/2022  Today's healthcare provider: [name], PA-C   Chief Complaint  Patient presents with   Hypertension   Diabetes   Subjective    HPI  Pt presents today for an A1c check. Refuses a full DM f/u. Reports he is having a L hip replacement and needs an updated sugar level.   Medications: Outpatient Medications Prior to Visit  Medication Sig   amLODipine (NORVASC) 10 MG tablet TAKE 1 TABLET BY MOUTH EVERY DAY IN THE EVENING   hydrochlorothiazide (HYDRODIURIL) 25 MG tablet TAKE 1 TABLET BY MOUTH EVERY DAY IN THE MORNING. Please schedule office visit before any future refill.   losartan (COZAAR) 100 MG tablet Take 1 tablet (100 mg total) by mouth every evening.   metFORMIN (GLUCOPHAGE XR) 750 MG 24 hr tablet Take 1 tablet (750 mg total) by mouth daily with breakfast.   No facility-administered medications prior to visit.   Review of Systems  Constitutional:  Negative for appetite change, chills and fever.  Respiratory:  Negative for chest tightness, shortness of breath and wheezing.   Cardiovascular:  Negative for chest pain and palpitations.  Gastrointestinal:  Negative for abdominal pain, nausea and vomiting.     Objective    BP (!) 150/93 (BP Location: Left Arm, Patient Position: Sitting, Cuff Size: Normal)   Pulse (!) 101   Temp 98 F (36.7 C) (Oral)   Resp 16   Wt 172 lb (78 kg)   SpO2 98% Comment: room air  BMI 24.68 kg/m   Physical Exam Constitutional:      General: He is awake.     Appearance: He is well-developed.  HENT:     Head: Normocephalic.  Eyes:     Conjunctiva/sclera: Conjunctivae normal.   Cardiovascular:     Rate and Rhythm: Normal rate and regular rhythm.     Heart sounds: Normal heart sounds.  Pulmonary:     Effort: Pulmonary effort is normal.     Breath sounds: Normal breath sounds.  Skin:    General: Skin is warm.  Neurological:     Mental Status: He is alert and oriented to person, place, and time.  Psychiatric:        Attention and Perception: Attention normal.        Mood and Affect: Mood normal.        Speech: Speech normal.        Behavior: Behavior is cooperative.     Results for orders placed or performed in visit on 07/02/22  POCT HgB A1C  Result Value Ref Range   Hemoglobin A1C 7.5 (A) 4.0 - 5.6 %   Est. average glucose Bld gHb Est-mCnc 169     Assessment & Plan     Problem List Items Addressed This Visit       Cardiovascular and Mediastinum   Hypertension associated with diabetes ([facility])    Elevated in office. Pt refuses to discuss management today. Pt states he only takes medications M-F takes no medications on the weekends. Advised against this. Refuses labs        Endocrine   Type 2 diabetes mellitus ([name]) - Primary  Pt reports he is taking metformin consistently. poc a1c today 7.5% Advised pt that we need updated lab work, at minimum a CMP to decide what additional medications are appropriate, and to evaluate kidney function as a result of uncontrolled diabetes Pt declines labwork today, declines any additional work up.  Pt declines to make follow up appointment.        Relevant Orders   POCT HgB A1C (Completed)   Please note this is not clearance for surgery. Please note that this does not count as a follow up visit, for future refills of medications pt needs to follow up appropriately in office.  Return ASAP for full follow up and chronic condition care.     I, Colin Ferguson, PA-C have reviewed all documentation for this visit. The documentation on  07/02/2022  for the exam, diagnosis, procedures, and orders are all accurate  and complete.  Colin Ferguson, PA-C [facility] [address] [place], [place], [address] Office: [phone] Fax: [phone]   [facility]

## 2022-07-12 NOTE — Telephone Encounter (Signed)
Pt's spouse Jenny Reichmann called in to follow up. She says that Dr Luanna Cole office is stating that they received a old results. They would like to have the results from 10/10 sent to them via fax as soon as possible. They are currently holding out on pt's surgery until this is received. Please assist further.

## 2022-07-12 NOTE — Telephone Encounter (Signed)
Result was faxed

## 2022-07-17 ENCOUNTER — Telehealth: Payer: Self-pay

## 2022-07-17 NOTE — Telephone Encounter (Signed)
Done    Copied from [place] [phone]. Topic: General - Other >> Jul 17, 2022  9:47 AM FBPZWCHE J wrote: Reason for CRM: Judeen Hammans with Dr. Luanna Cole office is calling in to request pt's most recent labs. (A1C) they are wanting to schedule pt for surgery and need those labs.   Please fax to: Porfirio Oar [phone]

## 2022-08-05 NOTE — Progress Notes (Signed)
Surgery orders requested via Epic inbox. °

## 2022-08-07 DIAGNOSIS — M1612 Unilateral primary osteoarthritis, left hip: Secondary | ICD-10-CM | POA: Diagnosis not present

## 2022-08-09 NOTE — Progress Notes (Addendum)
Labs to be drawn DOS per [place], [place]. Pt had A1C drawn 07/02/22, 7.5. During last lab draw patient needed to be restrained by 3 people not including the phlebotomist.   COVID Vaccine Completed: yes  Date of COVID positive in last 90 days: no  PCP - Julieanne Manson, MD Cardiologist - n/a  Chest x-ray - n/a EKG - 06/13/22 Epic Stress Test - n/a ECHO - n/a Cardiac Cath - n/a Pacemaker/ICD device last checked: n/a Spinal Cord Stimulator: n/a  Bowel Prep - no  Sleep Study - n/a CPAP -   Fasting Blood Sugar - no checks at home Checks Blood Sugar  times a day  Last dose of GLP1 agonist-  N/A GLP1 instructions:  N/A   Last dose of SGLT-2 inhibitors-  N/A SGLT-2 instructions: N/A   Blood Thinner Instructions: n/a Aspirin Instructions: Last Dose:  Activity level: Can go up a flight of stairs and perform activities of daily living without stopping and without symptoms of chest pain or shortness of breath.    Anesthesia review: A1C 7.5, HTN,  DM2  Patient denies shortness of breath, fever, cough and chest pain at PAT appointment  Patient verbalized understanding of instructions that were given to them at the PAT appointment. Patient was also instructed that they will need to review over the PAT instructions again at home before surgery.

## 2022-08-12 ENCOUNTER — Encounter (HOSPITAL_COMMUNITY): Payer: Self-pay

## 2022-08-12 ENCOUNTER — Encounter (HOSPITAL_COMMUNITY)
Admission: RE | Admit: 2022-08-12 | Discharge: 2022-08-12 | Disposition: A | Discharge status: Home / Self Care | Payer: BC Managed Care – PPO | Source: Ambulatory Visit | Attending: Orthopedic Surgery | Admitting: Orthopedic Surgery

## 2022-08-12 VITALS — BP 164/93 | HR 100 | Temp 98.0°F | Resp 14 | Ht 70.0 in | Wt 176.0 lb

## 2022-08-12 DIAGNOSIS — E1159 Type 2 diabetes mellitus with other circulatory complications: Secondary | ICD-10-CM | POA: Diagnosis not present

## 2022-08-12 DIAGNOSIS — Z01812 Encounter for preprocedural laboratory examination: Secondary | ICD-10-CM | POA: Diagnosis not present

## 2022-08-12 DIAGNOSIS — E1165 Type 2 diabetes mellitus with hyperglycemia: Secondary | ICD-10-CM | POA: Diagnosis not present

## 2022-08-12 DIAGNOSIS — Z01818 Encounter for other preprocedural examination: Secondary | ICD-10-CM

## 2022-08-12 DIAGNOSIS — I152 Hypertension secondary to endocrine disorders: Secondary | ICD-10-CM | POA: Insufficient documentation

## 2022-08-12 HISTORY — DX: Autistic disorder: F84.0

## 2022-08-12 HISTORY — DX: Personal history of urinary calculi: Z87.442

## 2022-08-12 HISTORY — DX: Essential (primary) hypertension: I10

## 2022-08-12 HISTORY — DX: Type 2 diabetes mellitus without complications: E11.9

## 2022-08-12 HISTORY — DX: Unspecified osteoarthritis, unspecified site: M19.90

## 2022-08-12 LAB — SURGICAL PCR SCREEN
MRSA, PCR: NEGATIVE
Staphylococcus aureus: POSITIVE — AB

## 2022-08-12 NOTE — Patient Instructions (Signed)
SURGICAL WAITING ROOM VISITATION Patients having surgery or a procedure may have no more than 2 support people in the waiting area - these visitors may rotate.   Children under the age of 65 must have an adult with them who is not the patient. If the patient needs to stay at the [facility] during part of their recovery, the visitor guidelines for inpatient rooms apply. Pre-op nurse will coordinate an appropriate time for 1 support person to accompany patient in pre-op.  This support person may not rotate.    Please refer to the [facility] website for the visitor guidelines for Inpatients (after your surgery is over and you are in a regular room).    Your procedure is scheduled on: 08/20/22   [facility]    Report to admitting at 11:45 AM   Call this number if you have problems the morning of surgery [phone]   Do not eat food :After Midnight.   After Midnight you may have the following liquids until 11:15 AM DAY OF SURGERY  Water Non-Citrus Juices (without pulp, NO RED) Carbonated Beverages Black Coffee (NO MILK/CREAM OR CREAMERS, sugar ok)  Clear Tea (NO MILK/CREAM OR CREAMERS, sugar ok) regular and decaf                             Plain Jell-O (NO RED)                                           Fruit ices (not with fruit pulp, NO RED)                                     Popsicles (NO RED)                                                               Sports drinks like Gatorade (NO RED)               The day of surgery:  Drink ONE (1) Pre-Surgery G2 at 11:15 AM the morning of surgery. Drink in one sitting. Do not sip.  This drink was given to you during your [facility]  pre-op appointment visit. Nothing else to drink after completing the  Pre-Surgery G2.          If you have questions, please contact your surgeon's office.   FOLLOW BOWEL PREP AND ANY ADDITIONAL PRE OP INSTRUCTIONS YOU RECEIVED FROM YOUR SURGEON'S OFFICE!!!     Oral Hygiene  is also important to reduce your risk of infection.                                    Remember - BRUSH YOUR TEETH THE MORNING OF SURGERY WITH YOUR REGULAR TOOTHPASTE   Do NOT smoke after Midnight   Take these medicines the morning of surgery with A SIP OF WATER: Amlodipine   DO NOT TAKE ANY ORAL DIABETIC MEDICATIONS DAY OF YOUR SURGERY  How to  Manage Your Diabetes Before and After Surgery  Why is it important to control my blood sugar before and after surgery? Improving blood sugar levels before and after surgery helps healing and can limit problems. A way of improving blood sugar control is eating a healthy diet by:  Eating less sugar and carbohydrates  Increasing activity/exercise  Talking with your doctor about reaching your blood sugar goals High blood sugars (greater than 180 mg/dL) can raise your risk of infections and slow your recovery, so you will need to focus on controlling your diabetes during the weeks before surgery. Make sure that the doctor who takes care of your diabetes knows about your planned surgery including the date and location.  How do I manage my blood sugar before surgery? Check your blood sugar at least 4 times a day, starting 2 days before surgery, to make sure that the level is not too high or low. Check your blood sugar the morning of your surgery when you wake up and every 2 hours until you get to the Short Stay unit. If your blood sugar is less than 70 mg/dL, you will need to treat for low blood sugar: Do not take insulin. Treat a low blood sugar (less than 70 mg/dL) with  cup of clear juice (cranberry or apple), 4 glucose tablets, OR glucose gel. Recheck blood sugar in 15 minutes after treatment (to make sure it is greater than 70 mg/dL). If your blood sugar is not greater than 70 mg/dL on recheck, call [phone] for further instructions. Report your blood sugar to the short stay nurse when you get to Short Stay.  If you are admitted to the  [facility] after surgery: Your blood sugar will be checked by the staff and you will probably be given insulin after surgery (instead of oral diabetes medicines) to make sure you have good blood sugar levels. The goal for blood sugar control after surgery is 80-180 mg/dL.   WHAT DO I DO ABOUT MY DIABETES MEDICATION?  Do not take oral diabetes medicines (pills) the morning of surgery.  THE DAY BEFORE SURGERY, take Metformin as prescribed.      THE MORNING OF SURGERY, do not take Metformin.  Reviewed and Endorsed by [facility] Patient Education Committee, August 2015  Bring CPAP mask and tubing day of surgery.                              You may not have any metal on your body including jewelry, and body piercing             Do not wear lotions, powders, cologne, or deodorant              Men may shave face and neck.   Do not bring valuables to the [facility]. [facility] IS NOT             RESPONSIBLE   FOR VALUABLES.   Contacts, dentures or bridgework may not be worn into surgery.  DO NOT BRING YOUR HOME MEDICATIONS TO THE [facility]. PHARMACY WILL DISPENSE MEDICATIONS LISTED ON YOUR MEDICATION LIST TO YOU DURING YOUR ADMISSION IN THE [facility]!    Patients discharged on the day of surgery will not be allowed to drive home.  Someone NEEDS to stay with you for the first 24 hours after anesthesia.              Please read over the following fact sheets you  were given: IF YOU HAVE QUESTIONS ABOUT YOUR PRE-OP INSTRUCTIONS PLEASE CALL [phone]   If you received a COVID test during your pre-op visit  it is requested that you wear a mask when out in public, stay away from anyone that may not be feeling well and notify your surgeon if you develop symptoms. If you test positive for Covid or have been in contact with anyone that has tested positive in the last 10 days please notify you surgeon.    [place] - Preparing for Surgery Before surgery, you can play an important role.   Because skin is not sterile, your skin needs to be as free of germs as possible.  You can reduce the number of germs on your skin by washing with CHG (chlorahexidine gluconate) soap before surgery.  CHG is an antiseptic cleaner which kills germs and bonds with the skin to continue killing germs even after washing. Please DO NOT use if you have an allergy to CHG or antibacterial soaps.  If your skin becomes reddened/irritated stop using the CHG and inform your nurse when you arrive at Short Stay. Do not shave (including legs and underarms) for at least 48 hours prior to the first CHG shower.  You may shave your face/neck.  Please follow these instructions carefully:  1.  Shower with CHG Soap the night before surgery and the  morning of surgery.  2.  If you choose to wash your hair, wash your hair first as usual with your normal  shampoo.  3.  After you shampoo, rinse your hair and body thoroughly to remove the shampoo.                             4.  Use CHG as you would any other liquid soap.  You can apply chg directly to the skin and wash.  Gently with a scrungie or clean washcloth.  5.  Apply the CHG Soap to your body ONLY FROM THE NECK DOWN.   Do   not use on face/ open                           Wound or open sores. Avoid contact with eyes, ears mouth and   genitals (private parts).                       Wash face,  Genitals (private parts) with your normal soap.             6.  Wash thoroughly, paying special attention to the area where your    surgery  will be performed.  7.  Thoroughly rinse your body with warm water from the neck down.  8.  DO NOT shower/wash with your normal soap after using and rinsing off the CHG Soap.                9.  Pat yourself dry with a clean towel.            10.  Wear clean pajamas.            11.  Place clean sheets on your bed the night of your first shower and do not  sleep with pets. Day of Surgery : Do not apply any lotions/deodorants the morning of  surgery.  Please wear clean clothes to the [facility]/surgery center.  FAILURE TO FOLLOW THESE INSTRUCTIONS MAY  RESULT IN THE CANCELLATION OF YOUR SURGERY  PATIENT SIGNATURE_________________________________  NURSE SIGNATURE__________________________________  ________________________________________________________________________  [facility]  An incentive spirometer is a tool that can help keep your lungs clear and active. This tool measures how well you are filling your lungs with each breath. Taking long deep breaths may help reverse or decrease the chance of developing breathing (pulmonary) problems (especially infection) following: A long period of time when you are unable to move or be active. BEFORE THE PROCEDURE  If the spirometer includes an indicator to show your best effort, your nurse or respiratory therapist will set it to a desired goal. If possible, sit up straight or lean slightly forward. Try not to slouch. Hold the incentive spirometer in an upright position. INSTRUCTIONS FOR USE  Sit on the edge of your bed if possible, or sit up as far as you can in bed or on a chair. Hold the incentive spirometer in an upright position. Breathe out normally. Place the mouthpiece in your mouth and seal your lips tightly around it. Breathe in slowly and as deeply as possible, raising the piston or the ball toward the top of the column. Hold your breath for 3-5 seconds or for as long as possible. Allow the piston or ball to fall to the bottom of the column. Remove the mouthpiece from your mouth and breathe out normally. Rest for a few seconds and repeat Steps 1 through 7 at least 10 times every 1-2 hours when you are awake. Take your time and take a few normal breaths between deep breaths. The spirometer may include an indicator to show your best effort. Use the indicator as a goal to work toward during each repetition. After each set of 10 deep breaths, practice coughing to be  sure your lungs are clear. If you have an incision (the cut made at the time of surgery), support your incision when coughing by placing a pillow or rolled up towels firmly against it. Once you are able to get out of bed, walk around indoors and cough well. You may stop using the incentive spirometer when instructed by your caregiver.  RISKS AND COMPLICATIONS Take your time so you do not get dizzy or light-headed. If you are in pain, you may need to take or ask for pain medication before doing incentive spirometry. It is harder to take a deep breath if you are having pain. AFTER USE Rest and breathe slowly and easily. It can be helpful to keep track of a log of your progress. Your caregiver can provide you with a simple table to help with this. If you are using the spirometer at home, follow these instructions: SEEK MEDICAL CARE IF:  You are having difficultly using the spirometer. You have trouble using the spirometer as often as instructed. Your pain medication is not giving enough relief while using the spirometer. You develop fever of 100.5 F (38.1 C) or higher. SEEK IMMEDIATE MEDICAL CARE IF:  You cough up bloody sputum that had not been present before. You develop fever of 102 F (38.9 C) or greater. You develop worsening pain at or near the incision site. MAKE SURE YOU:  Understand these instructions. Will watch your condition. Will get help right away if you are not doing well or get worse. Document Released: 01/20/2007 Document Revised: 12/02/2011 Document Reviewed: 03/23/2007 [facility] Patient Information 2014 [place], [place].   ________________________________________________________________________

## 2022-08-13 NOTE — Progress Notes (Signed)
[facility] results routed to Dr. Landau  

## 2022-08-14 NOTE — H&P (Signed)
HIP ARTHROPLASTY ADMISSION H&P  Patient ID: [facility] MRN: [id number] DOB/AGE: 06-08-1956 66 y.o.  Chief Complaint: left hip pain.  Planned Procedure Date: 08/20/22 Medical Clearance by Dr. Sullivan Lone     HPI: TARENCE SEARCY is a 66 y.o. male who presents for evaluation of djd Left hip. The patient has a history of pain and functional disability in the left hip due to arthritis and has failed non-surgical conservative treatments for greater than 12 weeks to include NSAID's and/or analgesics, corticosteriod injections, and activity modification.  Onset of symptoms was gradual, starting 2 years ago with gradually worsening course since that time. The patient noted no past surgery on the left hip.  Patient currently rates pain at 5 out of 10 with activity. Patient has pain that interferes with activities of daily living and pain with passive range of motion.  Patient has evidence of joint space narrowing by imaging studies.  There is no active infection.  Past Medical History:  Diagnosis Date   Arthritis    low back   Autism    Back pain    Diabetes mellitus without complication (HCC)    History of kidney stones    Hypertension    No past surgical history on file. Allergies  Allergen Reactions   Codeine Rash   Prior to Admission medications   Medication Sig Start Date End Date Taking? Authorizing Provider  amLODipine (NORVASC) 10 MG tablet TAKE 1 TABLET BY MOUTH EVERY DAY IN THE EVENING Patient taking differently: Take 10 mg by mouth daily. TAKE 1 TABLET BY MOUTH EVERY DAY IN THE EVENING 07/16/21  Yes Maple Hudson., MD  hydrochlorothiazide (HYDRODIURIL) 25 MG tablet TAKE 1 TABLET BY MOUTH EVERY DAY IN THE MORNING. Please schedule office visit before any future refill. Patient taking differently: Take 25 mg by mouth daily. 02/12/22  Yes Maple Hudson., MD  ibuprofen (ADVIL) 200 MG tablet Take 800 mg by mouth daily.   Yes [provider]  losartan (COZAAR)  100 MG tablet Take 1 tablet (100 mg total) by mouth every evening. Patient taking differently: Take 100 mg by mouth daily. 09/04/21  Yes Caro Laroche, DO  metFORMIN (GLUCOPHAGE XR) 750 MG 24 hr tablet Take 1 tablet (750 mg total) by mouth daily with breakfast. 09/04/21  Yes Rumball, Darl Householder, DO  Baclofen 5 MG TABS Take 1 tablet by mouth every 8 (eight) hours as needed. Patient not taking: Reported on 08/12/2022 05/30/22   [provider]  traMADol (ULTRAM) 50 MG tablet Take 50 mg by mouth every 6 (six) hours as needed. Patient not taking: Reported on 08/12/2022 05/30/22   [provider]   Social History   Socioeconomic History   Marital status: Married    Spouse name: Not on file   Number of children: Not on file   Years of education: Not on file   Highest education level: Not on file  Occupational History   Not on file  Tobacco Use   Smoking status: Every Day    Packs/day: 2.00    Types: Cigarettes   Smokeless tobacco: Never  Vaping Use   Vaping Use: Never used  Substance and Sexual Activity   Alcohol use: Yes    Comment: 2 malt beers a day   Drug use: No   Sexual activity: Not on file  Other Topics Concern   Not on file  Social History Narrative   Not on file   Social Determinants of Health  Financial Resource Strain: Not on file  Food Insecurity: Not on file  Transportation Needs: Not on file  Physical Activity: Not on file  Stress: Not on file  Social Connections: Not on file   Family History  Problem Relation Age of Onset   Dementia Father    Parkinson's disease Father     ROS: Currently denies lightheadedness, dizziness, Fever, chills, CP, SOB.   No personal history of DVT, PE, MI, or CVA. No loose teeth or dentures All other systems have been reviewed and were otherwise currently negative with the exception of those mentioned in the HPI and as above.  Objective: Vitals: Ht: 5\' 10"  Wt: 178 lbs Temp: 98.0 BP: 143/95 Pulse: 93 O2 100%  on room air.   Physical Exam: General: Alert, NAD. Trendelenberg Gait  HEENT: EOMI, Good Neck Extension  Pulm: No increased work of breathing.  Clear B/L A/P w/o crackle or wheeze.  CV: RRR, No m/g/r appreciated  GI: soft, NT, ND Neuro: Neuro without gross focal deficit.  Sensation intact distally Skin: No lesions in the area of chief complaint MSK/Surgical Site: left Hip pain with passive ROM.  Positive Stinchfield.  5/5 strength.  NVI.  Left leg longer than right.   Imaging Review Plain radiographs demonstrate severe degenerative joint disease of the left hip.   The bone quality appears to be adequate for age and reported activity level.  Preoperative templating of the joint replacement has been completed, documented, and submitted to the Operating Room personnel in order to optimize intra-operative equipment management.  Assessment: djd Left hip Active Problems:   * No active hospital problems. *   Plan: Plan for Procedure(s): TOTAL HIP ARTHROPLASTY  The patient history, physical exam, clinical judgement of the provider and imaging are consistent with end stage degenerative joint disease and total joint arthroplasty is deemed medically necessary. The treatment options including medical management, injection therapy, and arthroplasty were discussed at length. The risks and benefits of Procedure(s): TOTAL HIP ARTHROPLASTY were presented and reviewed.  The risks of nonoperative treatment, versus surgical intervention including but not limited to continued pain, aseptic loosening, stiffness, dislocation/subluxation, infection, bleeding, nerve injury, blood clots, cardiopulmonary complications, morbidity, mortality, among others were discussed. The patient verbalizes understanding and wishes to proceed with the plan.    Dental prophylaxis discussed and recommended for 2 years postoperatively.  The patient does meet the criteria for TXA which will be used perioperatively.   ASA 325  mg will be used postoperatively for DVT prophylaxis in addition to SCDs, and early ambulation. The patient is planning to be discharged home with OPPT in care of his wife   08/14/2022 5:20 PM

## 2022-08-20 ENCOUNTER — Ambulatory Visit (HOSPITAL_COMMUNITY): Payer: BC Managed Care – PPO | Admitting: Physician Assistant

## 2022-08-20 ENCOUNTER — Other Ambulatory Visit: Payer: Self-pay

## 2022-08-20 ENCOUNTER — Ambulatory Visit (HOSPITAL_COMMUNITY): Payer: BC Managed Care – PPO

## 2022-08-20 ENCOUNTER — Ambulatory Visit (HOSPITAL_COMMUNITY): Payer: BC Managed Care – PPO | Admitting: Registered Nurse

## 2022-08-20 ENCOUNTER — Encounter (HOSPITAL_COMMUNITY)
Admission: RE | Disposition: A | Discharge status: Home / Self Care | Payer: Self-pay | Source: Home / Self Care | Attending: Orthopedic Surgery

## 2022-08-20 ENCOUNTER — Ambulatory Visit (HOSPITAL_COMMUNITY)
Admission: RE | Admit: 2022-08-20 | Discharge: 2022-08-20 | Disposition: A | Discharge status: Home / Self Care | Payer: BC Managed Care – PPO | Attending: Orthopedic Surgery | Admitting: Orthopedic Surgery

## 2022-08-20 ENCOUNTER — Encounter (HOSPITAL_COMMUNITY): Payer: Self-pay | Admitting: Orthopedic Surgery

## 2022-08-20 DIAGNOSIS — Z7984 Long term (current) use of oral hypoglycemic drugs: Secondary | ICD-10-CM | POA: Insufficient documentation

## 2022-08-20 DIAGNOSIS — F17200 Nicotine dependence, unspecified, uncomplicated: Secondary | ICD-10-CM | POA: Diagnosis not present

## 2022-08-20 DIAGNOSIS — E1165 Type 2 diabetes mellitus with hyperglycemia: Secondary | ICD-10-CM

## 2022-08-20 DIAGNOSIS — M1612 Unilateral primary osteoarthritis, left hip: Secondary | ICD-10-CM | POA: Diagnosis not present

## 2022-08-20 DIAGNOSIS — Z96642 Presence of left artificial hip joint: Secondary | ICD-10-CM | POA: Diagnosis not present

## 2022-08-20 DIAGNOSIS — I152 Hypertension secondary to endocrine disorders: Secondary | ICD-10-CM

## 2022-08-20 DIAGNOSIS — E119 Type 2 diabetes mellitus without complications: Secondary | ICD-10-CM | POA: Diagnosis not present

## 2022-08-20 DIAGNOSIS — F17210 Nicotine dependence, cigarettes, uncomplicated: Secondary | ICD-10-CM | POA: Insufficient documentation

## 2022-08-20 DIAGNOSIS — Z471 Aftercare following joint replacement surgery: Secondary | ICD-10-CM | POA: Diagnosis not present

## 2022-08-20 DIAGNOSIS — J449 Chronic obstructive pulmonary disease, unspecified: Secondary | ICD-10-CM | POA: Diagnosis not present

## 2022-08-20 HISTORY — PX: TOTAL HIP ARTHROPLASTY: SHX124

## 2022-08-20 LAB — BASIC METABOLIC PANEL
Anion gap: 14 (ref 5–15)
BUN: 12 mg/dL (ref 8–23)
CO2: 22 mmol/L (ref 22–32)
Calcium: 9.4 mg/dL (ref 8.9–10.3)
Chloride: 98 mmol/L (ref 98–111)
Creatinine, Ser: 0.68 mg/dL (ref 0.61–1.24)
GFR, Estimated: >60 mL/min (ref >60)
Glucose, Bld: 214 mg/dL — ABNORMAL HIGH (ref 70–99)
Potassium: 3.7 mmol/L (ref 3.5–5.1)
Sodium: 134 mmol/L — ABNORMAL LOW (ref 135–145)

## 2022-08-20 LAB — CBC
HCT: 49.8 % (ref 39.0–52.0)
Hemoglobin: 17.4 g/dL — ABNORMAL HIGH (ref 13.0–17.0)
MCH: 34.9 pg — ABNORMAL HIGH (ref 26.0–34.0)
MCHC: 34.9 g/dL (ref 30.0–36.0)
MCV: 99.8 fL (ref 80.0–100.0)
Platelets: 307 10*3/uL (ref 150–400)
RBC: 4.99 MIL/uL (ref 4.22–5.81)
RDW: 12 % (ref 11.5–15.5)
WBC: 10.2 10*3/uL (ref 4.0–10.5)
nRBC: 0 % (ref 0.0–0.2)

## 2022-08-20 LAB — GLUCOSE, CAPILLARY
Glucose-Capillary: 204 mg/dL — ABNORMAL HIGH (ref 70–99)
Glucose-Capillary: 218 mg/dL — ABNORMAL HIGH (ref 70–99)
Glucose-Capillary: 167 mg/dL — ABNORMAL HIGH (ref 70–99)

## 2022-08-20 SURGERY — ARTHROPLASTY, HIP, TOTAL,POSTERIOR APPROACH
Anesthesia: Spinal | Site: Hip | Laterality: Left

## 2022-08-20 MED ORDER — OXYCODONE HCL 5 MG PO TABS: 5.0000 mg | ORAL_TABLET | ORAL | 0 refills | Status: AC | PRN

## 2022-08-20 MED ORDER — METHOCARBAMOL 500 MG PO TABS: 500.0000 mg | ORAL_TABLET | Freq: Four times a day (QID) | ORAL | Status: DC | PRN

## 2022-08-20 MED ORDER — POVIDONE-IODINE 10 % EX SWAB: 2.0000 | Freq: Once | CUTANEOUS | Status: DC

## 2022-08-20 MED ORDER — 0.9 % SODIUM CHLORIDE (POUR BTL) OPTIME
TOPICAL | Status: DC | PRN
  Administered 2022-08-20: 1000 mL

## 2022-08-20 MED ORDER — ASPIRIN 325 MG PO TBEC: 325.0000 mg | DELAYED_RELEASE_TABLET | Freq: Two times a day (BID) | ORAL | 0 refills | Status: AC

## 2022-08-20 MED ORDER — OXYCODONE HCL 5 MG PO TABS: 5.0000 mg | ORAL_TABLET | ORAL | Status: DC | PRN

## 2022-08-20 MED ORDER — EPHEDRINE SULFATE-NACL 50-0.9 MG/10ML-% IV SOSY
PREFILLED_SYRINGE | INTRAVENOUS | Status: DC | PRN
  Administered 2022-08-20 (×2): 5 mg via INTRAVENOUS

## 2022-08-20 MED ORDER — ACETAMINOPHEN 325 MG PO TABS: 325.0000 mg | ORAL_TABLET | Freq: Four times a day (QID) | ORAL | Status: DC | PRN

## 2022-08-20 MED ORDER — LACTATED RINGERS IV BOLUS
250.0000 mL | Freq: Once | INTRAVENOUS | Status: AC
  Administered 2022-08-20: 250 mL via INTRAVENOUS

## 2022-08-20 MED ORDER — AMISULPRIDE (ANTIEMETIC) 5 MG/2ML IV SOLN: 10.0000 mg | Freq: Once | INTRAVENOUS | Status: DC | PRN

## 2022-08-20 MED ORDER — SENNA-DOCUSATE SODIUM 8.6-50 MG PO TABS: 2.0000 | ORAL_TABLET | Freq: Every day | ORAL | 1 refills | Status: AC

## 2022-08-20 MED ORDER — KETOROLAC TROMETHAMINE 30 MG/ML IJ SOLN
INTRAMUSCULAR | Status: AC
  Filled 2022-08-20: qty 1

## 2022-08-20 MED ORDER — FENTANYL CITRATE PF 50 MCG/ML IJ SOSY: 25.0000 ug | PREFILLED_SYRINGE | INTRAMUSCULAR | Status: DC | PRN

## 2022-08-20 MED ORDER — ASPIRIN 325 MG PO TBEC: 325.0000 mg | DELAYED_RELEASE_TABLET | Freq: Every day | ORAL | Status: DC

## 2022-08-20 MED ORDER — OXYCODONE HCL 5 MG PO TABS: 10.0000 mg | ORAL_TABLET | ORAL | Status: DC | PRN

## 2022-08-20 MED ORDER — PHENOL 1.4 % MT LIQD: 1.0000 | OROMUCOSAL | Status: DC | PRN

## 2022-08-20 MED ORDER — PHENYLEPHRINE 80 MCG/ML (10ML) SYRINGE FOR IV PUSH (FOR BLOOD PRESSURE SUPPORT)
PREFILLED_SYRINGE | INTRAVENOUS | Status: DC | PRN
  Administered 2022-08-20 (×2): 160 ug via INTRAVENOUS

## 2022-08-20 MED ORDER — PHENYLEPHRINE HCL-NACL 20-0.9 MG/250ML-% IV SOLN
INTRAVENOUS | Status: DC | PRN
  Administered 2022-08-20: 50 ug/min via INTRAVENOUS

## 2022-08-20 MED ORDER — METFORMIN HCL ER 750 MG PO TB24: 750.0000 mg | ORAL_TABLET | Freq: Every day | ORAL | Status: DC

## 2022-08-20 MED ORDER — PROPOFOL 1000 MG/100ML IV EMUL
INTRAVENOUS | Status: AC
  Filled 2022-08-20: qty 100

## 2022-08-20 MED ORDER — PROPOFOL 10 MG/ML IV BOLUS
INTRAVENOUS | Status: DC | PRN
  Administered 2022-08-20 (×2): 20 mg via INTRAVENOUS

## 2022-08-20 MED ORDER — LACTATED RINGERS IV BOLUS
500.0000 mL | Freq: Once | INTRAVENOUS | Status: AC
  Administered 2022-08-20: 500 mL via INTRAVENOUS

## 2022-08-20 MED ORDER — FENTANYL CITRATE (PF) 100 MCG/2ML IJ SOLN
INTRAMUSCULAR | Status: AC
  Filled 2022-08-20: qty 2

## 2022-08-20 MED ORDER — HYDROCHLOROTHIAZIDE 25 MG PO TABS: 25.0000 mg | ORAL_TABLET | Freq: Every day | ORAL | Status: DC

## 2022-08-20 MED ORDER — MAGNESIUM CITRATE PO SOLN: 1.0000 | Freq: Once | ORAL | Status: DC | PRN

## 2022-08-20 MED ORDER — CHLORHEXIDINE GLUCONATE 0.12 % MT SOLN
15.0000 mL | Freq: Once | OROMUCOSAL | Status: AC
  Administered 2022-08-20: 15 mL via OROMUCOSAL

## 2022-08-20 MED ORDER — ACETAMINOPHEN 500 MG PO TABS: 1000.0000 mg | ORAL_TABLET | Freq: Four times a day (QID) | ORAL | Status: DC

## 2022-08-20 MED ORDER — TRANEXAMIC ACID-NACL 1000-0.7 MG/100ML-% IV SOLN: 1000.0000 mg | Freq: Once | INTRAVENOUS | Status: DC

## 2022-08-20 MED ORDER — LACTATED RINGERS IV SOLN: INTRAVENOUS | Status: DC

## 2022-08-20 MED ORDER — BUPIVACAINE HCL (PF) 0.25 % IJ SOLN
INTRAMUSCULAR | Status: AC
  Filled 2022-08-20: qty 30

## 2022-08-20 MED ORDER — LACTATED RINGERS IV BOLUS: 250.0000 mL | Freq: Once | INTRAVENOUS | Status: DC

## 2022-08-20 MED ORDER — PROMETHAZINE HCL 25 MG/ML IJ SOLN: 6.2500 mg | INTRAMUSCULAR | Status: DC | PRN

## 2022-08-20 MED ORDER — BUPIVACAINE IN DEXTROSE 0.75-8.25 % IT SOLN
INTRATHECAL | Status: DC | PRN
  Administered 2022-08-20: 2 mL via INTRATHECAL

## 2022-08-20 MED ORDER — OXYCODONE HCL 5 MG/5ML PO SOLN: 5.0000 mg | Freq: Once | ORAL | Status: DC | PRN

## 2022-08-20 MED ORDER — ORAL CARE MOUTH RINSE: 15.0000 mL | Freq: Once | OROMUCOSAL | Status: AC

## 2022-08-20 MED ORDER — ALUM & MAG HYDROXIDE-SIMETH 200-200-20 MG/5ML PO SUSP: 30.0000 mL | ORAL | Status: DC | PRN

## 2022-08-20 MED ORDER — AMLODIPINE BESYLATE 10 MG PO TABS: 10.0000 mg | ORAL_TABLET | Freq: Every day | ORAL | Status: DC

## 2022-08-20 MED ORDER — EPHEDRINE 5 MG/ML INJ
INTRAVENOUS | Status: AC
  Filled 2022-08-20: qty 5

## 2022-08-20 MED ORDER — POLYETHYLENE GLYCOL 3350 17 G PO PACK: 17.0000 g | PACK | Freq: Every day | ORAL | Status: DC | PRN

## 2022-08-20 MED ORDER — METHOCARBAMOL 500 MG IVPB - SIMPLE MED: 500.0000 mg | Freq: Four times a day (QID) | INTRAVENOUS | Status: DC | PRN

## 2022-08-20 MED ORDER — BUPIVACAINE HCL (PF) 0.25 % IJ SOLN
INTRAMUSCULAR | Status: DC | PRN
  Administered 2022-08-20: 30 mL

## 2022-08-20 MED ORDER — HYDROMORPHONE HCL 1 MG/ML IJ SOLN: 0.5000 mg | INTRAMUSCULAR | Status: DC | PRN

## 2022-08-20 MED ORDER — INSULIN ASPART 100 UNIT/ML IJ SOLN
2.0000 [IU] | Freq: Once | INTRAMUSCULAR | Status: AC
  Administered 2022-08-20: 2 [IU] via SUBCUTANEOUS

## 2022-08-20 MED ORDER — METOCLOPRAMIDE HCL 5 MG/ML IJ SOLN: 5.0000 mg | Freq: Three times a day (TID) | INTRAMUSCULAR | Status: DC | PRN

## 2022-08-20 MED ORDER — ACETAMINOPHEN 500 MG PO TABS
1000.0000 mg | ORAL_TABLET | Freq: Once | ORAL | Status: AC
  Administered 2022-08-20: 1000 mg via ORAL

## 2022-08-20 MED ORDER — ONDANSETRON HCL 4 MG PO TABS: 4.0000 mg | ORAL_TABLET | Freq: Three times a day (TID) | ORAL | 0 refills | Status: AC | PRN

## 2022-08-20 MED ORDER — LOSARTAN POTASSIUM 50 MG PO TABS: 100.0000 mg | ORAL_TABLET | Freq: Every day | ORAL | Status: DC

## 2022-08-20 MED ORDER — OXYCODONE HCL 5 MG PO TABS: 5.0000 mg | ORAL_TABLET | Freq: Once | ORAL | Status: DC | PRN

## 2022-08-20 MED ORDER — DEXAMETHASONE SODIUM PHOSPHATE 10 MG/ML IJ SOLN
INTRAMUSCULAR | Status: AC
  Filled 2022-08-20: qty 1

## 2022-08-20 MED ORDER — MIDAZOLAM HCL 5 MG/5ML IJ SOLN
INTRAMUSCULAR | Status: DC | PRN
  Administered 2022-08-20: 2 mg via INTRAVENOUS

## 2022-08-20 MED ORDER — INSULIN ASPART 100 UNIT/ML IJ SOLN
INTRAMUSCULAR | Status: AC
  Filled 2022-08-20: qty 1

## 2022-08-20 MED ORDER — POVIDONE-IODINE 10 % EX SWAB
2.0000 | Freq: Once | CUTANEOUS | Status: AC
  Administered 2022-08-20: 2 via TOPICAL

## 2022-08-20 MED ORDER — DIPHENHYDRAMINE HCL 12.5 MG/5ML PO ELIX: 12.5000 mg | ORAL_SOLUTION | ORAL | Status: DC | PRN

## 2022-08-20 MED ORDER — ONDANSETRON HCL 4 MG/2ML IJ SOLN
INTRAMUSCULAR | Status: DC | PRN
  Administered 2022-08-20: 4 mg via INTRAVENOUS

## 2022-08-20 MED ORDER — CEFAZOLIN SODIUM-DEXTROSE 2-4 GM/100ML-% IV SOLN: 2.0000 g | Freq: Four times a day (QID) | INTRAVENOUS | Status: DC

## 2022-08-20 MED ORDER — KETOROLAC TROMETHAMINE 30 MG/ML IJ SOLN
INTRAMUSCULAR | Status: DC | PRN
  Administered 2022-08-20: 30 mg

## 2022-08-20 MED ORDER — PHENYLEPHRINE HCL-NACL 20-0.9 MG/250ML-% IV SOLN
INTRAVENOUS | Status: AC
  Filled 2022-08-20: qty 250

## 2022-08-20 MED ORDER — FENTANYL CITRATE (PF) 100 MCG/2ML IJ SOLN
INTRAMUSCULAR | Status: DC | PRN
  Administered 2022-08-20: 50 ug via INTRAVENOUS

## 2022-08-20 MED ORDER — MIDAZOLAM HCL 2 MG/2ML IJ SOLN
INTRAMUSCULAR | Status: AC
  Filled 2022-08-20: qty 2

## 2022-08-20 MED ORDER — PROPOFOL 10 MG/ML IV BOLUS
INTRAVENOUS | Status: AC
  Filled 2022-08-20: qty 20

## 2022-08-20 MED ORDER — MENTHOL 3 MG MT LOZG: 1.0000 | LOZENGE | OROMUCOSAL | Status: DC | PRN

## 2022-08-20 MED ORDER — STERILE WATER FOR IRRIGATION IR SOLN
Status: DC | PRN
  Administered 2022-08-20: 2000 mL

## 2022-08-20 MED ORDER — PROPOFOL 500 MG/50ML IV EMUL
INTRAVENOUS | Status: DC | PRN
  Administered 2022-08-20: 125 ug/kg/min via INTRAVENOUS

## 2022-08-20 MED ORDER — ONDANSETRON HCL 4 MG/2ML IJ SOLN: 4.0000 mg | Freq: Four times a day (QID) | INTRAMUSCULAR | Status: DC | PRN

## 2022-08-20 MED ORDER — DOCUSATE SODIUM 100 MG PO CAPS: 100.0000 mg | ORAL_CAPSULE | Freq: Two times a day (BID) | ORAL | Status: DC

## 2022-08-20 MED ORDER — PHENYLEPHRINE 80 MCG/ML (10ML) SYRINGE FOR IV PUSH (FOR BLOOD PRESSURE SUPPORT)
PREFILLED_SYRINGE | INTRAVENOUS | Status: AC
  Filled 2022-08-20: qty 10

## 2022-08-20 MED ORDER — BISACODYL 10 MG RE SUPP: 10.0000 mg | Freq: Every day | RECTAL | Status: DC | PRN

## 2022-08-20 MED ORDER — ONDANSETRON HCL 4 MG PO TABS: 4.0000 mg | ORAL_TABLET | Freq: Four times a day (QID) | ORAL | Status: DC | PRN

## 2022-08-20 MED ORDER — POVIDONE-IODINE 7.5 % EX SOLN: Freq: Once | CUTANEOUS | Status: DC

## 2022-08-20 MED ORDER — ACETAMINOPHEN 500 MG PO TABS
1000.0000 mg | ORAL_TABLET | Freq: Once | ORAL | Status: DC
  Filled 2022-08-20: qty 2

## 2022-08-20 MED ORDER — TRANEXAMIC ACID-NACL 1000-0.7 MG/100ML-% IV SOLN
1000.0000 mg | INTRAVENOUS | Status: AC
  Administered 2022-08-20: 1000 mg via INTRAVENOUS
  Filled 2022-08-20: qty 100

## 2022-08-20 MED ORDER — CEFAZOLIN SODIUM-DEXTROSE 2-4 GM/100ML-% IV SOLN
2.0000 g | INTRAVENOUS | Status: AC
  Administered 2022-08-20: 2 g via INTRAVENOUS
  Filled 2022-08-20: qty 100

## 2022-08-20 MED ORDER — METOCLOPRAMIDE HCL 5 MG PO TABS: 5.0000 mg | ORAL_TABLET | Freq: Three times a day (TID) | ORAL | Status: DC | PRN

## 2022-08-20 MED ORDER — DEXAMETHASONE SODIUM PHOSPHATE 10 MG/ML IJ SOLN: 10.0000 mg | Freq: Once | INTRAMUSCULAR | Status: DC

## 2022-08-20 MED ORDER — ONDANSETRON HCL 4 MG/2ML IJ SOLN
INTRAMUSCULAR | Status: AC
  Filled 2022-08-20: qty 2

## 2022-08-20 MED ORDER — DEXAMETHASONE SODIUM PHOSPHATE 10 MG/ML IJ SOLN
INTRAMUSCULAR | Status: DC | PRN
  Administered 2022-08-20: 10 mg via INTRAVENOUS

## 2022-08-20 SURGICAL SUPPLY — 58 items
BAG COUNTER SPONGE SURGICOUNT (BAG) IMPLANT
BALL HIP CERAMIC (Hips) IMPLANT
BLADE SAW SGTL 73X25 THK (BLADE) ×1 IMPLANT
CLSR STERI-STRIP ANTIMIC 1/2X4 (GAUZE/BANDAGES/DRESSINGS) ×2 IMPLANT
COVER SURGICAL LIGHT HANDLE (MISCELLANEOUS) ×1 IMPLANT
CUP SECTOR GRIPTON 50MM (Cup) IMPLANT
DRAPE INCISE IOBAN 66X45 STRL (DRAPES) ×1 IMPLANT
DRAPE ORTHO SPLIT 77X108 STRL (DRAPES) ×2
DRAPE POUCH INSTRU U-SHP 10X18 (DRAPES) ×1 IMPLANT
DRAPE SHEET LG 3/4 BI-LAMINATE (DRAPES) ×1 IMPLANT
DRAPE SURG 17X11 SM STRL (DRAPES) ×1 IMPLANT
DRAPE SURG ORHT 6 SPLT 77X108 (DRAPES) ×2 IMPLANT
DRAPE U-SHAPE 47X51 STRL (DRAPES) ×1 IMPLANT
DRSG MEPILEX POST OP 4X8 (GAUZE/BANDAGES/DRESSINGS) ×1 IMPLANT
DURAPREP 26ML APPLICATOR (WOUND CARE) ×2 IMPLANT
ELECT BLADE TIP CTD 4 INCH (ELECTRODE) ×1 IMPLANT
ELECT REM PT RETURN 15FT ADLT (MISCELLANEOUS) ×1 IMPLANT
ELIMINATOR HOLE APEX DEPUY (Hips) IMPLANT
FACESHIELD WRAPAROUND (MASK) ×3 IMPLANT
FACESHIELD WRAPAROUND OR TEAM (MASK) ×2 IMPLANT
GLOVE BIO SURGEON STRL SZ 6.5 (GLOVE) ×1 IMPLANT
GLOVE BIO SURGEON STRL SZ7.5 (GLOVE) ×1 IMPLANT
GLOVE BIOGEL PI IND STRL 7.0 (GLOVE) ×1 IMPLANT
GLOVE BIOGEL PI IND STRL 8 (GLOVE) ×1 IMPLANT
GOWN STRL SURGICAL XL XLNG (GOWN DISPOSABLE) ×2 IMPLANT
HIP BALL CERAMIC (Hips) ×1 IMPLANT
HOOD PEEL AWAY T7 (MISCELLANEOUS) ×2 IMPLANT
K-WIRE 2.0 (WIRE) ×1
K-WIRE TROCAR PT 2.0 150MM (WIRE) ×1
KIT BASIN OR (CUSTOM PROCEDURE TRAY) ×1 IMPLANT
KIT TURNOVER KIT A (KITS) IMPLANT
KWIRE TROCAR PT 2.0 150 (WIRE) ×1 IMPLANT
KWIRE TROCAR PT 2.0 150MM (WIRE) ×1 IMPLANT
LINER ACET PNNCL PLUS4 NEUTRAL (Hips) IMPLANT
MANIFOLD NEPTUNE II (INSTRUMENTS) ×1 IMPLANT
NDL MA TROC 1/2 (NEEDLE) IMPLANT
NDL SAFETY ECLIP 18X1.5 (MISCELLANEOUS) ×2 IMPLANT
NEEDLE ANCHOR KEITH 2 7/8 STR (NEEDLE) ×1 IMPLANT
NEEDLE MA TROC 1/2 (NEEDLE) IMPLANT
NS IRRIG 1000ML POUR BTL (IV SOLUTION) ×1 IMPLANT
PACK TOTAL JOINT (CUSTOM PROCEDURE TRAY) ×1 IMPLANT
PINNACLE PLUS 4 NEUTRAL (Hips) ×1 IMPLANT
PROTECTOR NERVE ULNAR (MISCELLANEOUS) ×1 IMPLANT
SCREW 6.5MMX25MM (Screw) IMPLANT
SUCTION FRAZIER HANDLE 12FR (TUBING) ×1
SUCTION TUBE FRAZIER 12FR DISP (TUBING) ×1 IMPLANT
SUT ETHIBOND NAB CT1 #1 30IN (SUTURE) ×3 IMPLANT
SUT VIC AB 0 CT1 36 (SUTURE) ×1 IMPLANT
SUT VIC AB 1 CT1 36 (SUTURE) ×2 IMPLANT
SUT VIC AB 2-0 CT1 27 (SUTURE) ×3
SUT VIC AB 2-0 CT1 TAPERPNT 27 (SUTURE) ×3 IMPLANT
SUT VIC AB 3-0 SH 8-18 (SUTURE) ×1 IMPLANT
SYR CONTROL 10ML LL (SYRINGE) ×2 IMPLANT
TAP DUOFIX SZ5 STD OFF (Hips) IMPLANT
TOWEL OR 17X26 10 PK STRL BLUE (TOWEL DISPOSABLE) ×1 IMPLANT
TRAY FOLEY MTR SLVR 16FR STAT (SET/KITS/TRAYS/PACK) ×1 IMPLANT
TUBE SUCTION HIGH CAP CLEAR NV (SUCTIONS) ×1 IMPLANT
WATER STERILE IRR 1000ML POUR (IV SOLUTION) ×2 IMPLANT

## 2022-08-20 NOTE — Anesthesia Preprocedure Evaluation (Addendum)
Anesthesia Evaluation  Patient identified by MRN, date of birth, ID band Patient awake    Reviewed: Allergy & Precautions, NPO status , Patient's Chart, lab work & pertinent test results  Airway Mallampati: II  TM Distance: >3 FB Neck ROM: Full    Dental  (+) Poor Dentition, Missing, Chipped   Pulmonary COPD, Current SmokerPatient did not abstain from smoking.   Pulmonary exam normal        Cardiovascular hypertension, Pt. on medications Normal cardiovascular exam     Neuro/Psych  PSYCHIATRIC DISORDERS      negative neurological ROS     GI/Hepatic negative GI ROS, Neg liver ROS,,,  Endo/Other  diabetes, Oral Hypoglycemic Agents    Renal/GU negative Renal ROS     Musculoskeletal  (+) Arthritis ,    Abdominal   Peds  Hematology negative hematology ROS (+)   Anesthesia Other Findings djd Left hip  Reproductive/Obstetrics                             Anesthesia Physical Anesthesia Plan  ASA: 3  Anesthesia Plan: Spinal   Post-op Pain Management:    Induction: Intravenous  PONV Risk Score and Plan: 1 and Ondansetron, Dexamethasone, Propofol infusion, Midazolam and Treatment may vary due to age or medical condition  Airway Management Planned: Simple Face Mask  Additional Equipment:   Intra-op Plan:   Post-operative Plan:   Informed Consent: I have reviewed the patients History and Physical, chart, labs and discussed the procedure including the risks, benefits and alternatives for the proposed anesthesia with the patient or authorized representative who has indicated his/her understanding and acceptance.     Dental advisory given  Plan Discussed with: CRNA  Anesthesia Plan Comments:         Anesthesia Quick Evaluation

## 2022-08-20 NOTE — Op Note (Signed)
08/20/2022  8:55 AM  PATIENT:  Colin Jackson   MRN: [id number]  PRE-OPERATIVE DIAGNOSIS: Left hip primary localized osteoarthritis  POST-OPERATIVE DIAGNOSIS:  same  PROCEDURE:  Procedure(s): TOTAL HIP ARTHROPLASTY  PREOPERATIVE INDICATIONS:    Colin Jackson is an 66 y.o. male who has a diagnosis of left hip primary localized osteoarthritis and elected for surgical management after failing conservative treatment.  The risks benefits and alternatives were discussed with the patient including but not limited to the risks of nonoperative treatment, versus surgical intervention including infection, bleeding, nerve injury, periprosthetic fracture, the need for revision surgery, dislocation, leg length discrepancy, blood clots, cardiopulmonary complications, morbidity, mortality, among others, and they were willing to proceed.     OPERATIVE REPORT     SURGEON:  Marchia Bond, MD    ASSISTANT:  Merlene Pulling, PA-C, (Present throughout the entire procedure,  necessary for completion of procedure in a timely manner, assisting with retraction, instrumentation, and closure)     ANESTHESIA: Spinal  ESTIMATED BLOOD LOSS: 301 mL    COMPLICATIONS:  None.     UNIQUE ASPECTS OF THE CASE: Leg lengths were difficult to assess because of his coexisting contralateral leg length discrepancy.  Nonetheless I had excellent capsular closure.  I did not want to make him longer, but I did need to make him appropriate.  He had advanced arthrosis.  I did ultimately medialize to the medial wall on the acetabulum, and had excellent fill and fit.  I can see 2 mm of anterior wall.  I had bleeding cancellous bone nearly circumferentially, even with just with a size 50 although the femoral head measured 49, but nonetheless I felt going bigger would take away bone unnecessarily.  I reamed to a size 5, I was actually surprised to get the 5 down, I had 1 tooth showing and it was fairly tight.  The reduction felt  appropriate.  He was stable to 45 degrees of internal rotation with the hip flexed to 90 degrees.  COMPONENTS:  [employer] fit femur size 5 standard offset with a 32 mm + 5 ceramic head ball and a Gription Acetabular shell size 50, with a single cancellous screw for backup fixation, with an apex hole eliminator and a +4 neutral polyethylene liner.    PROCEDURE IN DETAIL:   The patient was met in the holding area and  identified.  The appropriate hip was identified and marked at the operative site.  The patient was then transported to the OR  and  placed under anesthesia.  At that point, the patient was  placed in the lateral decubitus position with the operative side up and  secured to the operating room table and all bony prominences padded.     The operative lower extremity was prepped from the iliac crest to the distal leg.  Sterile draping was performed.  Time out was performed prior to incision.      A routine posterolateral approach was utilized via sharp dissection  carried down to the subcutaneous tissue.  Gross bleeders were Bovie coagulated.  The iliotibial band was identified and incised along the length of the skin incision.  Self-retaining retractors were  inserted.  With the hip internally rotated, the short external rotators  were identified. The piriformis and capsule was tagged with FiberWire, and the hip capsule released in a T-type fashion.  The femoral neck was exposed, and I resected the femoral neck using the appropriate jig. This was performed at approximately a  thumb's breadth above the lesser trochanter.    I then exposed the deep acetabulum, cleared out any tissue including the ligamentum teres.  A wing retractor was placed.  After adequate visualization, I excised the labrum, and then sequentially reamed.  I placed the trial acetabulum, which seated nicely, and then impacted the real cup into place.  Appropriate version and inclination was confirmed clinically  matching their bony anatomy, and also with the use of the jig.  I placed a cancellous screw to augment fixation.  A trial polyethylene liner was placed and the wing retractor removed.    I then prepared the proximal femur using the cookie-cutter, the lateralizing reamer, and then sequentially reamed and broached.  A trial broach, neck, and head was utilized, and I reduced the hip and it was found to have excellent stability with functional range of motion. The trial components were then removed, and the real polyethylene liner was placed.  I then impacted the real femoral prosthesis into place into the appropriate version, slightly anteverted to the normal anatomy, and I impacted the real head ball into place. The hip was then reduced and taken through functional range of motion and found to have excellent stability. Leg lengths were restored.  I then used a 2 mm drill bits to pass the FiberWire suture from the capsule and piriformis through the greater trochanter, and secured this. Excellent posterior capsular repair was achieved. I also closed the T in the capsule.  I then irrigated the hip copiously again with pulse lavage, and repaired the fascia with Vicryl, followed by Vicryl for the subcutaneous tissue, Monocryl for the skin, Steri-Strips and sterile gauze. The wounds were injected. The patient was then awakened and returned to PACU in stable and satisfactory condition. There were no complications.  Marchia Bond, MD Orthopedic Surgeon [phone]   08/20/2022 8:55 AM

## 2022-08-20 NOTE — Anesthesia Procedure Notes (Signed)
Spinal  Patient location during procedure: OR End time: 08/20/2022 7:29 AM Reason for block: surgical anesthesia Staffing Performed: resident/CRNA  Resident/CRNA: Enriqueta Shutter D, CRNA Performed by: Korrin Waterfield, Clinical cytogeneticist D, CRNA Authorized by: Leonides Grills, MD   Preanesthetic Checklist Completed: patient identified, IV checked, site marked, risks and benefits discussed, surgical consent, monitors and equipment checked, pre-op evaluation and timeout performed Spinal Block Patient position: sitting Prep: DuraPrep Patient monitoring: heart rate, continuous pulse ox and blood pressure Approach: midline Location: L3-4 Injection technique: single-shot Needle Needle type: Pencan  Needle gauge: 24 G Needle length: 9 cm Assessment Sensory level: T6 Events: CSF return

## 2022-08-20 NOTE — Interval H&P Note (Signed)
History and Physical Interval Note:  08/20/2022 7:14 AM  Colin Jackson  has presented today for surgery, with the diagnosis of djd Left hip.  The various methods of treatment have been discussed with the patient and family. After consideration of risks, benefits and other options for treatment, the patient has consented to  Procedure(s): TOTAL HIP ARTHROPLASTY (Left) as a surgical intervention.  The patient's history has been reviewed, patient examined, no change in status, stable for surgery.  I have reviewed the patient's chart and labs.  Questions were answered to the patient's satisfaction.     [name]

## 2022-08-20 NOTE — Evaluation (Signed)
Physical Therapy Evaluation Patient Details Name: Colin Jackson MRN: [id number] DOB: 1956-07-02 Today's Date: 08/20/2022  History of Present Illness  Pt is a 66yo male presenting s/p L-tKA on 08/20/22. PMH: DM, HTN, low back pain.  Clinical Impression  Colin Jackson is a 66 y.o. male POD 0 s/p L-TKA. Patient reports modified independence using SPC with mobility at baseline. Patient is now limited by functional impairments (see PT problem list below) and requires min guard for transfers and gait with RW. Patient was able to ambulate 100 feet with RW and min guard and cues for safe walker management. Patient educated on safe sequencing for stair mobility and verbalized safe guarding position for people assisting with mobility. Patient instructed in exercises to facilitate ROM and circulation. Patient will benefit from continued skilled PT interventions to address impairments and progress towards PLOF. Patient has met mobility goals at adequate level for discharge home; will continue to follow if pt continues acute stay to progress towards Mod I goals.       Recommendations for follow up therapy are one component of a multi-disciplinary discharge planning process, led by the attending physician.  Recommendations may be updated based on patient status, additional functional criteria and insurance authorization.  Follow Up Recommendations Follow physician's recommendations for discharge plan and follow up therapies      Assistance Recommended at Discharge Frequent or constant Supervision/Assistance  Patient can return home with the following  A little help with walking and/or transfers;A little help with bathing/dressing/bathroom;Assistance with cooking/housework;Assist for transportation;Help with stairs or ramp for entrance    Equipment Recommendations Rolling walker (2 wheels)  Recommendations for Other Services       Functional Status Assessment Patient has had a recent decline in their  functional status and demonstrates the ability to make significant improvements in function in a reasonable and predictable amount of time.     Precautions / Restrictions Precautions Precautions: Fall Precaution Comments: posterior hip, no formal hip precuations Restrictions Weight Bearing Restrictions: No Other Position/Activity Restrictions: wbat      Mobility  Bed Mobility Overal bed mobility: Needs Assistance Bed Mobility: Supine to Sit     Supine to sit: Supervision, HOB elevated     General bed mobility comments: For safety only    Transfers Overall transfer level: Needs assistance Equipment used: Rolling walker (2 wheels) Transfers: Sit to/from Stand Sit to Stand: Min guard, From elevated surface           General transfer comment: for safety only    Ambulation/Gait Ambulation/Gait assistance: Min guard Gait Distance (Feet): 100 Feet Assistive device: Rolling walker (2 wheels) Gait Pattern/deviations: Step-to pattern Gait velocity: decreased     General Gait Details: Pt ambulated with RW and min guard, no physical assist requierd or overt LOB noted  Stairs Stairs: Yes Stairs assistance: Min guard Stair Management: Two rails, One rail Right, Step to pattern, Forwards, With cane Number of Stairs: 4 General stair comments: Pt completed stair mobility with 2 techniques: forward with b/l handrails and forward with R handrail and L SPC. Demonstrated safe mobility with min guard, no physical assist required or overt LOB noted, VCs for sequencing. Handout provided  Wheelchair Mobility    Modified Rankin (Stroke Patients Only)       Balance Overall balance assessment: Needs assistance Sitting-balance support: Feet supported, No upper extremity supported Sitting balance-Leahy Scale: Good     Standing balance support: Reliant on assistive device for balance, During functional activity, Bilateral upper extremity supported  Standing balance-Leahy Scale:  Poor                               Pertinent Vitals/Pain Pain Assessment Pain Assessment: 0-10 Pain Score: 4  Pain Location: left hip Pain Descriptors / Indicators: Operative site guarding Pain Intervention(s): Limited activity within patient's tolerance, Monitored during session, Repositioned, Ice applied    Home Living Family/patient expects to be discharged to:: Private residence Living Arrangements: Spouse/significant other Available Help at Discharge: Family;Available 24 hours/day Type of Home: House Home Access: Stairs to enter Entrance Stairs-Rails: Right Entrance Stairs-Number of Steps: 4 Alternate Level Stairs-Number of Steps: 16 Home Layout: Two level;Able to live on main level with bedroom/bathroom;1/2 bath on main level Home Equipment: BSC/3in1;Cane - single point      Prior Function Prior Level of Function : Independent/Modified Independent;Driving             Mobility Comments: SPC at all times ADLs Comments: IND     Hand Dominance        Extremity/Trunk Assessment   Upper Extremity Assessment Upper Extremity Assessment: Overall WFL for tasks assessed    Lower Extremity Assessment Lower Extremity Assessment: RLE deficits/detail;LLE deficits/detail RLE Deficits / Details: MMT ank DF/PF 5/5 RLE Sensation: WNL LLE Deficits / Details: MMT ank DF/PF 5/5 LLE Sensation: WNL    Cervical / Trunk Assessment Cervical / Trunk Assessment: Normal  Communication   Communication: No difficulties  Cognition Arousal/Alertness: Awake/alert Behavior During Therapy: WFL for tasks assessed/performed Overall Cognitive Status: Within Functional Limits for tasks assessed                                          General Comments General comments (skin integrity, edema, etc.): Wife Colin Jackson present    Exercises Total Joint Exercises Ankle Circles/Pumps: AROM, Both, 10 reps Quad Sets: AROM, Left, Other reps (comment) (2) Short Arc  Quad: AROM, Left, Other reps (comment) (3) Heel Slides: AROM, Left, Other reps (comment) (3) Hip ABduction/ADduction: AROM, Left, Other reps (comment), Other (comment) (2; Standing: educated and demonstrated, not performed) Long Arc Quad: Other (comment) (educated and demonstrated, not performed) Knee Flexion: Other (comment) (Standing hamstrings:educated and demonstrated, not performed) Marching in Standing: Other (comment) (educated and demonstrated, not performed) Standing Hip Extension: Other (comment) (educated and demonstrated, not performed)   Assessment/Plan    PT Assessment Patient needs continued PT services  PT Problem List Decreased strength;Decreased range of motion;Decreased activity tolerance;Decreased balance;Decreased mobility;Decreased coordination;Pain       PT Treatment Interventions DME instruction;Gait training;Stair training;Functional mobility training;Therapeutic activities;Therapeutic exercise;Balance training;Neuromuscular re-education;Patient/family education    PT Goals (Current goals can be found in the Care Plan section)  Acute Rehab PT Goals Patient Stated Goal: Playing golf PT Goal Formulation: With patient Time For Goal Achievement: 08/27/22 Potential to Achieve Goals: Good    Frequency 7X/week     Co-evaluation               AM-PAC PT "6 Clicks" Mobility  Outcome Measure Help needed turning from your back to your side while in a flat bed without using bedrails?: None Help needed moving from lying on your back to sitting on the side of a flat bed without using bedrails?: A Little Help needed moving to and from a bed to a chair (including a wheelchair)?: A Little Help needed standing up  from a chair using your arms (e.g., wheelchair or bedside chair)?: A Little Help needed to walk in hospital room?: A Little Help needed climbing 3-5 steps with a railing? : A Little 6 Click Score: 19    End of Session Equipment Utilized During Treatment:  Gait belt Activity Tolerance: Patient tolerated treatment well;No increased pain Patient left: in chair;with call bell/phone within reach;with family/visitor present Nurse Communication: Mobility status PT Visit Diagnosis: Pain;Difficulty in walking, not elsewhere classified (R26.2) Pain - Right/Left: Left Pain - part of body: Hip    Time: 1330-1400 PT Time Calculation (min) (ACUTE ONLY): 30 min   Charges:   PT Evaluation $PT Eval Low Complexity: 1 Low PT Treatments $Gait Training: 8-22 mins        Coolidge Breeze, PT, DPT [facility] Office: [phone] Weekend pager: [phone]  Keshan Reha 08/20/2022, 2:06 PM

## 2022-08-20 NOTE — Transfer of Care (Signed)
Immediate Anesthesia Transfer of Care Note  Patient: Colin Jackson  Procedure(s) Performed: TOTAL HIP ARTHROPLASTY (Left: Hip)  Patient Location: PACU  Anesthesia Type:Spinal  Level of Consciousness: awake, alert , and oriented  Airway & Oxygen Therapy: Patient Spontanous Breathing and Patient connected to face mask oxygen  Post-op Assessment: Report given to RN and Post -op Vital signs reviewed and stable  Post vital signs: Reviewed and stable  Last Vitals:  Vitals Value Taken Time  BP 107/73 08/20/22 0920  Temp    Pulse 74 08/20/22 0921  Resp 19 08/20/22 0921  SpO2 100 % 08/20/22 0921  Vitals shown include unvalidated device data.  Last Pain:  Vitals:   08/20/22 0549  TempSrc: Oral         Complications: No notable events documented.

## 2022-08-20 NOTE — Discharge Instructions (Signed)

## 2022-08-20 NOTE — Anesthesia Postprocedure Evaluation (Signed)
Anesthesia Post Note  Patient: Colin Jackson  Procedure(s) Performed: TOTAL HIP ARTHROPLASTY (Left: Hip)     Patient location during evaluation: PACU Anesthesia Type: Spinal Level of consciousness: awake Pain management: pain level controlled Vital Signs Assessment: post-procedure vital signs reviewed and stable Respiratory status: spontaneous breathing, nonlabored ventilation and respiratory function stable Cardiovascular status: stable and blood pressure returned to baseline Postop Assessment: no apparent nausea or vomiting Anesthetic complications: no   No notable events documented.  Last Vitals:  Vitals:   08/20/22 1300 08/20/22 1330  BP: 124/79 128/88  Pulse: 93 97  Resp:    Temp:    SpO2: 99% 98%    Last Pain:  Vitals:   08/20/22 1330  TempSrc:   PainSc: 0-No pain                 Patsy Zaragoza P Lorielle Boehning

## 2022-08-21 ENCOUNTER — Encounter (HOSPITAL_COMMUNITY): Payer: Self-pay | Admitting: Orthopedic Surgery

## 2022-08-21 LAB — HEMOGLOBIN A1C
Hgb A1c MFr Bld: 7.2 % — ABNORMAL HIGH (ref 4.8–5.6)
Mean Plasma Glucose: 160 mg/dL

## 2022-09-02 DIAGNOSIS — M1612 Unilateral primary osteoarthritis, left hip: Secondary | ICD-10-CM | POA: Diagnosis not present

## 2022-09-17 ENCOUNTER — Other Ambulatory Visit: Payer: Self-pay | Admitting: Family Medicine

## 2022-09-17 DIAGNOSIS — I10 Essential (primary) hypertension: Secondary | ICD-10-CM

## 2022-09-30 DIAGNOSIS — M1612 Unilateral primary osteoarthritis, left hip: Secondary | ICD-10-CM | POA: Diagnosis not present

## 2022-10-22 ENCOUNTER — Other Ambulatory Visit: Payer: Self-pay | Admitting: Family Medicine

## 2022-10-22 DIAGNOSIS — I10 Essential (primary) hypertension: Secondary | ICD-10-CM

## 2022-10-23 NOTE — Telephone Encounter (Signed)
Requested medication (s) are due for refill today:   Yes  Requested medication (s) are on the active medication list:   Yes  Future visit scheduled:   No   See last OV note from [employer].   He refused any work up or having labs done.   The labs he had done were for a pre op work up not as part of his physical.   Last ordered: 09/04/2021 #90, 3 refills  Returned for provider to review since he refused a work up during his last office visit, refused labs and to make another appt.      Requested Prescriptions  Pending Prescriptions Disp Refills   metFORMIN (GLUCOPHAGE-XR) 750 MG 24 hr tablet [Pharmacy Med Name: METFORMIN HCL ER 750 MG TABLET] 90 tablet 2    Sig: TAKE 1 TABLET BY MOUTH EVERY DAY WITH BREAKFAST     Endocrinology:  Diabetes - Biguanides Failed - 10/22/2022 11:59 AM      Failed - B12 Level in normal range and within 720 days    No results found for: "VITAMINB12"       Failed - CBC within normal limits and completed in the last 12 months    WBC  Date Value Ref Range Status  08/20/2022 10.2 4.0 - 10.5 K/uL Final   RBC  Date Value Ref Range Status  08/20/2022 4.99 4.22 - 5.81 MIL/uL Final   Hemoglobin  Date Value Ref Range Status  08/20/2022 17.4 (H) 13.0 - 17.0 g/dL Final   HCT  Date Value Ref Range Status  08/20/2022 49.8 39.0 - 52.0 % Final   MCHC  Date Value Ref Range Status  08/20/2022 34.9 30.0 - 36.0 g/dL Final   [facility]  Date Value Ref Range Status  08/20/2022 34.9 (H) 26.0 - 34.0 pg Final   MCV  Date Value Ref Range Status  08/20/2022 99.8 80.0 - 100.0 fL Final   No results found for: "PLTCOUNTKUC", "LABPLAT", "POCPLA" RDW  Date Value Ref Range Status  08/20/2022 12.0 11.5 - 15.5 % Final         Passed - Cr in normal range and within 360 days    Creatinine, Ser  Date Value Ref Range Status  08/20/2022 0.68 0.61 - 1.24 mg/dL Final         Passed - HBA1C is between 0 and 7.9 and within 180 days    Hemoglobin A1C  Date Value Ref Range  Status  06/19/2017 6.9  Final   Hgb A1c MFr Bld  Date Value Ref Range Status  08/20/2022 7.2 (H) 4.8 - 5.6 % Final    Comment:    (NOTE)         Prediabetes: 5.7 - 6.4         Diabetes: >6.4         Glycemic control for adults with diabetes: <7.0          Passed - eGFR in normal range and within 360 days    GFR, Estimated  Date Value Ref Range Status  08/20/2022 >60 >60 mL/min Final    Comment:    (NOTE) Calculated using the CKD-EPI Creatinine Equation (2021)          Passed - Valid encounter within last 6 months    Recent Outpatient Visits           3 months ago Type 2 diabetes mellitus with hyperglycemia, without long-term current use of insulin ([place])   [place] Drubel,  Ria Comment, PA-C   4 months ago Type 2 diabetes mellitus without complication, without long-term current use of insulin [facility])   [place] [name], MD   1 year ago Type 2 diabetes mellitus without complication, without long-term current use of insulin [facility])   [place] Rory Percy M, DO   1 year ago Type 2 diabetes mellitus without complication, without long-term current use of insulin [facility])   [place] [name], MD   1 year ago Type 2 diabetes mellitus without complication, without long-term current use of insulin ([place])   [place] [name], MD               losartan (COZAAR) 100 MG tablet [Pharmacy Med Name: LOSARTAN POTASSIUM 100 MG TAB] 90 tablet 3    Sig: TAKE 1 TABLET BY MOUTH EVERY EVENING.     Cardiovascular:  Angiotensin Receptor Blockers Passed - 10/22/2022 11:59 AM      Passed - Cr in normal range and within 180 days    Creatinine, Ser  Date Value Ref Range Status  08/20/2022 0.68 0.61 - 1.24 mg/dL Final         Passed - K in normal range and within 180 days    Potassium  Date Value Ref Range Status   08/20/2022 3.7 3.5 - 5.1 mmol/L Final         Passed - Patient is not pregnant      Passed - Last BP in normal range    BP Readings from Last 1 Encounters:  08/20/22 128/88         Passed - Valid encounter within last 6 months    Recent Outpatient Visits           3 months ago Type 2 diabetes mellitus with hyperglycemia, without long-term current use of insulin ([place])   [place] Thedore Mins, [place], PA-C   4 months ago Type 2 diabetes mellitus without complication, without long-term current use of insulin ([place])   [place] [name], MD   1 year ago Type 2 diabetes mellitus without complication, without long-term current use of insulin [facility])   [place] Rory Percy M, DO   1 year ago Type 2 diabetes mellitus without complication, without long-term current use of insulin [facility])   [place] [name], MD   1 year ago Type 2 diabetes mellitus without complication, without long-term current use of insulin [facility])   [place] [name], MD

## 2022-11-14 ENCOUNTER — Other Ambulatory Visit: Payer: Self-pay | Admitting: Physician Assistant

## 2022-11-14 NOTE — Telephone Encounter (Signed)
Last seen 10/10/223 OV. Please advise

## 2022-11-15 ENCOUNTER — Other Ambulatory Visit: Payer: Self-pay | Admitting: Physician Assistant

## 2022-11-15 DIAGNOSIS — I10 Essential (primary) hypertension: Secondary | ICD-10-CM

## 2022-11-29 ENCOUNTER — Other Ambulatory Visit: Payer: Self-pay | Admitting: Physician Assistant

## 2022-11-29 DIAGNOSIS — I10 Essential (primary) hypertension: Secondary | ICD-10-CM
